# Patient Record
Sex: Female | Born: 1980 | Race: Black or African American | Hispanic: No | Marital: Single | Smoking: Never smoker
Health system: Southern US, Community
[De-identification: ages and names within clinical notes are randomized; demographics above are authoritative.]

## PROBLEM LIST (undated history)

## (undated) DIAGNOSIS — I10 Essential (primary) hypertension: Secondary | ICD-10-CM

## (undated) DIAGNOSIS — IMO0001 Reserved for inherently not codable concepts without codable children: Secondary | ICD-10-CM

## (undated) DIAGNOSIS — L0501 Pilonidal cyst with abscess: Secondary | ICD-10-CM

## (undated) DIAGNOSIS — E119 Type 2 diabetes mellitus without complications: Secondary | ICD-10-CM

## (undated) DIAGNOSIS — R030 Elevated blood-pressure reading, without diagnosis of hypertension: Secondary | ICD-10-CM

## (undated) HISTORY — DX: Essential (primary) hypertension: I10

## (undated) HISTORY — PX: PILONIDAL CYST EXCISION: SHX744

## (undated) HISTORY — DX: Morbid (severe) obesity due to excess calories: E66.01

## (undated) HISTORY — DX: Pilonidal cyst with abscess: L05.01

## (undated) HISTORY — DX: Elevated blood-pressure reading, without diagnosis of hypertension: R03.0

## (undated) HISTORY — PX: PILONIDAL CYST DRAINAGE: SHX743

## (undated) HISTORY — DX: Type 2 diabetes mellitus without complications: E11.9

## (undated) HISTORY — DX: Reserved for inherently not codable concepts without codable children: IMO0001

---

## 2002-03-02 ENCOUNTER — Emergency Department (HOSPITAL_COMMUNITY): Admission: EM | Admit: 2002-03-02 | Discharge: 2002-03-02 | Payer: Self-pay | Admitting: Emergency Medicine

## 2003-08-20 ENCOUNTER — Emergency Department (HOSPITAL_COMMUNITY): Admission: EM | Admit: 2003-08-20 | Discharge: 2003-08-20 | Payer: Self-pay | Admitting: Family Medicine

## 2003-08-23 ENCOUNTER — Observation Stay (HOSPITAL_COMMUNITY): Admission: AD | Admit: 2003-08-23 | Discharge: 2003-08-24 | Payer: Self-pay | Admitting: General Surgery

## 2004-12-18 ENCOUNTER — Other Ambulatory Visit: Admission: RE | Admit: 2004-12-18 | Discharge: 2004-12-18 | Payer: Self-pay | Admitting: Family Medicine

## 2005-08-21 ENCOUNTER — Encounter: Admission: RE | Admit: 2005-08-21 | Discharge: 2005-11-19 | Payer: Self-pay | Admitting: Family Medicine

## 2005-08-27 ENCOUNTER — Ambulatory Visit (HOSPITAL_COMMUNITY): Admission: RE | Admit: 2005-08-27 | Discharge: 2005-08-27 | Payer: Self-pay | Admitting: General Surgery

## 2005-08-27 ENCOUNTER — Encounter (INDEPENDENT_AMBULATORY_CARE_PROVIDER_SITE_OTHER): Payer: Self-pay | Admitting: *Deleted

## 2006-02-07 ENCOUNTER — Other Ambulatory Visit: Admission: RE | Admit: 2006-02-07 | Discharge: 2006-02-07 | Payer: Self-pay | Admitting: Family Medicine

## 2006-07-01 ENCOUNTER — Other Ambulatory Visit: Admission: RE | Admit: 2006-07-01 | Discharge: 2006-07-01 | Payer: Self-pay | Admitting: *Deleted

## 2006-07-17 ENCOUNTER — Other Ambulatory Visit: Admission: RE | Admit: 2006-07-17 | Discharge: 2006-07-17 | Payer: Self-pay | Admitting: *Deleted

## 2007-07-28 ENCOUNTER — Other Ambulatory Visit: Admission: RE | Admit: 2007-07-28 | Discharge: 2007-07-28 | Payer: Self-pay | Admitting: Family Medicine

## 2007-08-20 ENCOUNTER — Other Ambulatory Visit: Admission: RE | Admit: 2007-08-20 | Discharge: 2007-08-20 | Payer: Self-pay | Admitting: Family Medicine

## 2008-08-18 ENCOUNTER — Other Ambulatory Visit: Admission: RE | Admit: 2008-08-18 | Discharge: 2008-08-18 | Payer: Self-pay | Admitting: Family Medicine

## 2009-09-05 ENCOUNTER — Other Ambulatory Visit: Admission: RE | Admit: 2009-09-05 | Discharge: 2009-09-05 | Payer: Self-pay | Admitting: Family Medicine

## 2010-08-08 ENCOUNTER — Other Ambulatory Visit: Payer: Self-pay | Admitting: Internal Medicine

## 2010-08-08 ENCOUNTER — Other Ambulatory Visit (HOSPITAL_COMMUNITY)
Admission: RE | Admit: 2010-08-08 | Discharge: 2010-08-08 | Disposition: A | Discharge status: Home / Self Care | Payer: BC Managed Care – PPO | Source: Ambulatory Visit | Attending: Internal Medicine | Admitting: Internal Medicine

## 2010-08-08 DIAGNOSIS — Z01419 Encounter for gynecological examination (general) (routine) without abnormal findings: Secondary | ICD-10-CM | POA: Insufficient documentation

## 2010-11-10 NOTE — Op Note (Signed)
[name], LANTER NO.:  [id number]   MEDICAL RECORD NO.:  [id number]                   PATIENT TYPE:  AMB   LOCATION:  DAY                                  FACILITY:  [facility]   PHYSICIAN:  Ollen Gross. Vernell Morgans, M.D.              DATE OF BIRTH:  03-Aug-1980   DATE OF PROCEDURE:  08/23/2003  DATE OF DISCHARGE:                                 OPERATIVE REPORT   PREOPERATIVE DIAGNOSIS:  Pilonidal abscess.   POSTOPERATIVE DIAGNOSIS:  Pilonidal abscess.   PROCEDURE:  Incision and drainage of pilonidal abscess.   SURGEON:  Ollen Gross. Carolynne Edouard, M.D.   ANESTHESIA:  General endotracheal.   DESCRIPTION OF PROCEDURE:  After informed consent was obtained, the patient  was brought to the operating room, left in a supine position on the  stretcher.  After adequate induction of general anesthesia, the patient was  rolled onto the operating room table into a prone position and all pressure  points were padded.  Her gluteal cleft area was prepped with Betadine and  draped in the usual sterile manner.  The palpable fluctuant area was opened  longitudinally with a 15 blade knife.  A large amount of purulent material  was able to be evacuated.  The cavity was then fulgurated with the  electrocautery until the area was hemostatic.  The areas around the wound  were then infiltrated with 0.25% Marcaine with epinephrine.  The wound was  packed with gauze and sterile dressings were applied.  The patient tolerated  the procedure well.  At the end of the case all needle, sponge, and  instrument counts were correct.  The patient was then rolled back into a  supine position onto a stretcher and was awakened and taken to the recovery  room in stable condition.                                               Ollen Gross. Vernell Morgans, M.D.    PST/MEDQ  D:  08/24/2003  T:  08/24/2003  Job:  [id number]

## 2010-11-10 NOTE — H&P (Signed)
[name], [name] NO.:  [id number]   MEDICAL RECORD NO.:  [id number]                   PATIENT TYPE:  AMB   LOCATION:  DAY                                  FACILITY:  [facility]   PHYSICIAN:  Jimmye Norman III, M.D.               DATE OF BIRTH:  1981-01-25   DATE OF ADMISSION:  08/23/2003  DATE OF DISCHARGE:                                HISTORY & PHYSICAL   IDENTIFICATION/CHIEF COMPLAINT:  Patient is a 30 year old female with a  recurrent infected pilonidal abscess who is being admitted for emergency  drainage.   HISTORY OF PRESENT ILLNESS:  I briefly saw this patient in the urgent office  at [facility] for an infected pilonidal abscess.  This has  been coming up for the past 4 or 5 days and she has had three previous  drainages before, but no history of a complete excision.  She is not a  diabetic but has been septic from this before with temperatures up to 105  but not on this occasion.   PAST HISTORY:  Significant for these recurrent infections.  She is not  diabetic, has no heart problems, and no significant past medical history.  She has no significant medical problems, no diabetes, no heart problems.  She has no risk factors for infection.   PHYSICAL EXAMINATION:  On exam today she is afebrile, her other vitals are  stable.  She is about 5 feet 9-1/2 to 10 inches tall and weighs 350+ pounds.  I limited my examination to the patient's perineum and her gluteal area.  She had about a 5 x 5 cm very firm and tender fluctuant area just to the  right of the gluteal fold in the superior aspect representing a pilonidal  abscess.  There appeared to be a margin up to about 8 cm with lots of  induration and tenderness.  A rectal exam was not performed.   IMPRESSION:  Recurrent infected pilonidal abscess.   PLAN:  The initial plan was to try to get the patient on the schedule for  elective or semi-elective or urgent surgery tomorrow morning;  however, we  were able to get her on for surgery tonight and Dr. Carolynne Edouard has assumed care of  the patient over at [facility] and he will perform the incision  and drainage, and possibly let the patient go home.  She will get  preoperative antibiotics; amount and type I am not sure of, it will be based  on Dr. Billey Chang evaluation.                                               Kathrin Ruddy, M.D.    JW/MEDQ  D:  08/23/2003  T:  08/23/2003  Job:  [id number]

## 2010-11-10 NOTE — Op Note (Signed)
NAMEJOHNIYA, DURFEE             ACCOUNT NO.:  [id number]   MEDICAL RECORD NO.:  [id number]          PATIENT TYPE:  AMB   LOCATION:  SDS                          FACILITY:  [facility]   PHYSICIAN:  Cherylynn Ridges, M.D.    DATE OF BIRTH:  1981/03/02   DATE OF PROCEDURE:  08/27/2005  DATE OF DISCHARGE:  08/27/2005                                 OPERATIVE REPORT   PREOPERATIVE DIAGNOSIS:  Dormant pilonidal disease.   POSTOPERATIVE DIAGNOSIS:  Dormant pilonidal disease.   OPERATION PERFORMED:  Excision of dormant pilonidal disease.   SURGEON:  Marta Lamas. Lindie Spruce, M.D.   ANESTHESIA:  General endotracheal.   ESTIMATED BLOOD LOSS:  Less than 50 mL.   COMPLICATIONS:  None.   CONDITION:  Stable.   FINDINGS:  The patient had dormant pilonidal disease. No pus was  encountered.  The length of the incision was approximately 13 cm in total  length with the repaired suture line measuring 15 cm in total length.   INDICATIONS FOR PROCEDURE:  The patient is a 30 year old with recurrent  pilonidal disease who comes in now for definitive excision.   DESCRIPTION OF PROCEDURE:  The patient was taken to the operating room and  initially placed on the bed in the supine position.  After an adequate  endotracheal anesthetic was administered, she was flipped into the prone  position and prepped and draped in the usual sterile manner exposing the  superior gluteal area.   We measured the site of the incision as she had mostly disease on the right  perigluteal area.  We excised an oval piece measuring approximately 13 x 4  cm in size and excised it down to the fascia using electrocautery.  Cautery  was used to obtain adequate hemostasis.  We excised down to the fascia and  then removed it.  We marked the specimen at the superior margin and then  sent it off.  We obtained hemostasis, irrigated with saline, removed most  fat globules and then we closed.   We closed in three layers, a deep subcutaneous 2-0  Vicryl layer, more  superficial subcutaneous tissue 3-0 Vicryl layer.  Then the skin was closed  using interrupted simple stitches of 2-0 nylon.  0.5% Marcaine with  epinephrine was injected into the wound, a total of 28 mL used.  No  complications, condition stable.  Sterile dressing was applied.      Cherylynn Ridges, M.D.  Electronically Signed     JOW/MEDQ  D:  08/27/2005  T:  08/27/2005  Job:  [id number]

## 2010-12-29 ENCOUNTER — Ambulatory Visit
Admission: RE | Admit: 2010-12-29 | Discharge: 2010-12-29 | Disposition: A | Discharge status: Home / Self Care | Payer: BC Managed Care – PPO | Source: Ambulatory Visit | Attending: Family Medicine | Admitting: Family Medicine

## 2010-12-29 ENCOUNTER — Other Ambulatory Visit: Payer: Self-pay | Admitting: Family Medicine

## 2010-12-29 DIAGNOSIS — R52 Pain, unspecified: Secondary | ICD-10-CM

## 2011-01-08 ENCOUNTER — Ambulatory Visit (INDEPENDENT_AMBULATORY_CARE_PROVIDER_SITE_OTHER): Payer: BC Managed Care – PPO | Admitting: Family Medicine

## 2011-01-08 ENCOUNTER — Encounter: Payer: Self-pay | Admitting: Family Medicine

## 2011-01-08 VITALS — BP 144/88 | HR 72 | Ht 73.5 in | Wt 398.0 lb

## 2011-01-08 DIAGNOSIS — M654 Radial styloid tenosynovitis [de Quervain]: Secondary | ICD-10-CM

## 2011-01-08 NOTE — Progress Notes (Signed)
Patient presents with complaint of R wrist pain.  She was seen 7/6 by Dr. Abigail Miyamoto at [place] with same complaint, was also having some tingling in L wrist at the time.  She had hand and wrist x-rays bilaterally at [facility], which were normal.  Had pain 2-3 months prior in R wrist, but it had resolved. Recurred again, and has had pain now for 4 weeks.  She is dropping things (ie her cell phone), due to pain.  Took ibuprofen 800mg  4x for one day, which didn't help.  She got a Rx for meloxicam from Dr. Abigail Miyamoto, but never started it (because was told it was the same as the Ibuprofen that didn't work).  She was told to continue with working (lifting heavy boxes)--got worse after working.  This past weekend, while putting on her bra, she had acute worsening of pain, felt like something popped or snapped.  Doing a little bit better now.  She is having pain wiping herself in bathroom.  Pain with movement of her thumb--radiates up her forearm.  Hurts to rotate wrist, like with locking door or feeding herself.  She has a history of elevated blood pressure in the past, never on medication, just diet and exercise.  She monitors BP at home, running 130's/low 80's at home.  She works out regularly (recently re-started, but now limited by her wrist); and has lost 10 pounds or so in the last couple of weeks.  She had been taking Phentermine through a bariatric clinic, but hasn't gone ina while.  She would like to restart the phentermine in the future  Past Medical History  Diagnosis Date  . Elevated BP   . Pilonidal cyst with abscess     Past Surgical History  Procedure Date  . Pilonidal cyst drainage   . Pilonidal cyst excision     History   Social History  . Marital Status: Single    Spouse Name: N/A    Number of Children: 0  . Years of Education: N/A   Occupational History  . overnight International aid/development worker at [employer]    Social History Main Topics  . Smoking status: Never Smoker   . Smokeless tobacco:  Never Used  . Alcohol Use: Yes     1-2 drinks per month  . Drug Use: No  . Sexually Active: Yes    Birth Control/ Protection: Pill   Other Topics Concern  . Not on file   Social History Narrative  . No narrative on file    Family History  Problem Relation Age of Onset  . Hypertension Mother   . Hyperlipidemia Mother   . Hypertension Father   . Hyperlipidemia Father   . Heart disease Maternal Grandmother   . Hypertension Maternal Grandmother   . Diabetes Maternal Grandmother   . Diabetes Paternal Grandmother   . Hypertension Paternal Grandmother   . Cancer Neg Hx     Current outpatient prescriptions:B Complex-C (SUPER B COMPLEX PO), Take 1 tablet by mouth daily.  , Disp: , Rfl: ;  Cholecalciferol (VITAMIN D) 1000 UNITS capsule, Take 1,000 Units by mouth daily.  , Disp: , Rfl: ;  Melatonin 10 MG TABS, Take 1 tablet by mouth as needed.  , Disp: , Rfl: ;  Multiple Vitamins-Minerals (MULTIVITAMIN WITH MINERALS) tablet, Take 1 tablet by mouth daily.  , Disp: , Rfl:  norgestimate-ethinyl estradiol (ORTHO-CYCLEN) 0.25-35 MG-MCG per tablet, Take 1 tablet by mouth daily.  , Disp: , Rfl: ;  phentermine (ADIPEX-P) 37.5 MG  tablet, Take 37.5 mg by mouth daily before breakfast.  , Disp: , Rfl:   No Known Allergies  ROS:  Occasional tingling of L arm, had some tingling of R arm after applying ice. Denies headaches, chest pain, other joint pains, GI complaints, skin rashes, URI complaints or other problems.  PHYSICAL EXAM: BP 144/88  Pulse 72  Ht 6' 1.5" (1.867 m)  Wt 398 lb (180.532 kg)  BMI 51.80 kg/m2  LMP 12/29/2010 Well developed, pleasant, obese African American female in no distress, favoring and not using her R arm/wrist.  Moderate discomfort with examination. Neck: no lymphadenopathy or mass Heart: regular rate and rhythm without murmur Lungs: clear bilaterally Extremities:  No effusion, warmth or swelling at upper extremities.  Tenderness at R radial head (lateral aspect). Pain  with R thumb extension against resistance.  Pain with passive thumb flexion and Finkelstein test. Not tender at base of thumb or MCP joint, mainly just at radius.  L wrist--normal.  Negative Tinel and Phalen test. No pain with wrist flexion and extension, but + pain with pronation and supination against resistance.  Strength is 5/5 throughout  Review of x-ray reports of both wrists and hands from 7/6--all normal  ASSESSMENT/PLAN: 1. Tendonitis, deQuervains    Tendonitis, likely DeQuervains.  Recommend rest, thumb spica splint (rx written for her to [facility] to [facility]).  Take 10-14 day course of NSAID's rx'd by Dr. Abigail Miyamoto.  Discussed ice versus heat.  Light duty at work--no repetitive thumb/wrist movement or heavy lifting with R hand (not over 5 pounds for a week).  Follow up in 2 weeks--consider cortisone injection, vs PT, vs referral to hand specialist.  Elevated BP--continue to monitor elsewhere.  Continue with regular exercise and weight loss, low sodium diet.  She may be candidate for Phentermine in future--once she continues/maintains diet and exercise, and if BP's are improved.  F/u 2 weeks

## 2011-01-08 NOTE — Patient Instructions (Signed)
Rest; Ice vs heat (can try alternating).  Please take the anti-inflammatories as prescribed by Dr. Abigail Miyamoto.  Take for at least a week, likely will need for 10-14 days, or until better.  Follow up here in 2 weeks.  Avoid repetitive use of thumb, wrist.  Light duty (to avoid these movements) at work.  De Quervain's Disease Suzette Battiest disease is a condition often seen in racquet sports where there is a soreness (inflammation) in the cord like structures (tendons) which attach muscle to bone on the thumb side of the wrist. There may be a tightening of the tissues around the tendons. This condition is often helped by giving up or modifying the activity which caused it. When conservative treatment does not help, surgery may be required. Conservative treatment could include changes in the activity which brought about the problem or made it worse. Anti-inflammatory medications and injections may be used to help decrease the inflammation and help with pain control. Your caregiver will help you determine which is best for you. DIAGNOSIS Often the diagnosis (learning what is wrong) can be made by examination. Sometimes x-rays are required. HOME CARE INSTRUCTIONS  Apply ice to the sore area for 15 minutes, 2-3 times per day while awake. Put the ice in a plastic bag and place a towel between the bag of ice and your skin. This is especially helpful if it can be done after all activities involving the sore wrist.   Temporary splinting may help.   Only take over-the-counter or prescription medicines for pain, discomfort or fever as directed by your caregiver.  SEEK MEDICAL CARE IF:  Pain relief is not obtained with medications, or if you have increasing pain and seem to be getting worse rather than better.  MAKE SURE YOU:   Understand these instructions.   Will watch your condition.   Will get help right away if you are not doing well or get worse.  Document Released: 03/06/2001 Document Re-Released:  07/03/2009 [facility] Patient Information 2011 [place], [place].

## 2011-01-22 ENCOUNTER — Ambulatory Visit: Payer: BC Managed Care – PPO | Admitting: Family Medicine

## 2011-02-05 ENCOUNTER — Ambulatory Visit: Payer: BC Managed Care – PPO | Admitting: Family Medicine

## 2011-04-13 ENCOUNTER — Ambulatory Visit (INDEPENDENT_AMBULATORY_CARE_PROVIDER_SITE_OTHER): Payer: BC Managed Care – PPO | Admitting: Medical

## 2011-04-13 ENCOUNTER — Encounter: Payer: Self-pay | Admitting: Medical

## 2011-04-13 VITALS — BP 140/90 | HR 78 | Temp 98.4°F | Resp 20

## 2011-04-13 DIAGNOSIS — T8130XA Disruption of wound, unspecified, initial encounter: Secondary | ICD-10-CM

## 2011-04-13 DIAGNOSIS — IMO0002 Reserved for concepts with insufficient information to code with codable children: Secondary | ICD-10-CM

## 2011-04-13 DIAGNOSIS — L089 Local infection of the skin and subcutaneous tissue, unspecified: Secondary | ICD-10-CM

## 2011-04-13 DIAGNOSIS — T148XXA Other injury of unspecified body region, initial encounter: Secondary | ICD-10-CM

## 2011-04-13 MED ORDER — CEPHALEXIN 500 MG PO CAPS: ORAL_CAPSULE | ORAL | Status: AC

## 2011-04-13 MED ORDER — MUPIROCIN 2 % EX OINT: TOPICAL_OINTMENT | CUTANEOUS | Status: AC

## 2011-04-13 NOTE — Patient Instructions (Signed)
Clean wounds twice daily with soap and water.  Dab dry with towel  Apply mupirocin antibiotic ointment twice daily and cover with extra large band aids.  Use Keflex, 2 tablets twice daily by mouth for 10 days to cover for infection.  You can use 600 mg of Ibuprofen OTC twice daily for inflammation and swelling.  Elevated your leg for swelling as well.   Call if not improving.

## 2011-04-13 NOTE — Progress Notes (Signed)
Subjective:   HPI [name] is a 30 y.o. female who presents for skin concerns/wound.  Was in [place] last week, at a water park on raft ride and fell off.  When she fell off, she scratched her legs and feet in various places. Been using Neosporin topically but the left knee wound has had some pus, left foot wound has been swollen, but neither are healing up completely.   Last tetanus within last 5 years.  Denies head injury, LOC.   No other aggravating or relieving factors.  No other c/o.  The following portions of the patient's history were reviewed and updated as appropriate: allergies, current medications, past family history, past medical history, past social history, past surgical history and problem list.  Review of Systems Gen: no fever, chills, sweats GI: no abdominal pain, NVD MSK: no myalgia, arthralgia     Objective:   Physical Exam  General appearance: alert, no distress, WD/WN Skin: left knee and dorsal foot with 2.5 x 3 cm squarish patch of abrasion, erythema, and slight pus. No induration, fluctuance, or warmth.       Assessment :    Encounter Diagnoses  Name Primary?  . Skin infection Yes  . Wound disruption   . Abrasion      Plan:    She is UTD on tetanus booster per her report.   Begin Keflex, keep wound clean BID, use topical Mupirocin, cover with extra large band aids.  Recheck if not improving or worse.

## 2011-07-31 ENCOUNTER — Telehealth: Payer: Self-pay | Admitting: Internal Medicine

## 2011-07-31 NOTE — Telephone Encounter (Signed)
This is different than OCP in the system/med list (we have ortho cyclen listed--not "tri").  Per computer, it looks like last pap was a year ago.  Okay to refill x 1 month, and schedule CPE/pap.  Please confirm with pt which OCP she is taking. Thanks. Please remind her to check her BP's periodically, and bring list to her CPE visit.

## 2011-08-01 ENCOUNTER — Telehealth: Payer: Self-pay | Admitting: *Deleted

## 2011-08-01 DIAGNOSIS — IMO0001 Reserved for inherently not codable concepts without codable children: Secondary | ICD-10-CM

## 2011-08-01 MED ORDER — NORGESTIM-ETH ESTRAD TRIPHASIC 0.18/0.215/0.25 MG-35 MCG PO TABS: 1.0000 | ORAL_TABLET | Freq: Every day | ORAL | Status: DC

## 2011-08-01 NOTE — Telephone Encounter (Signed)
I went and pulled her chart and the notes from [place] do say that she has always been on ortho-tri cyclen. I changed in her med list and sent refill to pharmacy. I will call her and have her schedule CPE with PAP. Just an FYI.

## 2011-08-02 ENCOUNTER — Telehealth: Payer: Self-pay | Admitting: *Deleted

## 2011-08-02 NOTE — Telephone Encounter (Signed)
Called patient to let her know that her birth control pills were refilled for one month only. She needs to call office and get appt for CPE with PAP asap. Also to periodically be checking her blood pressures and bring a list to CPE.

## 2011-08-03 NOTE — Telephone Encounter (Signed)
done

## 2011-10-10 ENCOUNTER — Ambulatory Visit: Payer: BC Managed Care – PPO | Admitting: Women's Health

## 2011-10-30 ENCOUNTER — Encounter: Payer: Self-pay | Admitting: Internal Medicine

## 2011-11-02 ENCOUNTER — Encounter: Payer: Self-pay | Admitting: Family Medicine

## 2011-11-02 ENCOUNTER — Ambulatory Visit (INDEPENDENT_AMBULATORY_CARE_PROVIDER_SITE_OTHER): Payer: BC Managed Care – PPO | Admitting: Family Medicine

## 2011-11-02 VITALS — BP 144/90 | HR 90 | Temp 98.0°F

## 2011-11-02 DIAGNOSIS — J209 Acute bronchitis, unspecified: Secondary | ICD-10-CM

## 2011-11-02 MED ORDER — AMOXICILLIN 875 MG PO TABS: 875.0000 mg | ORAL_TABLET | Freq: Two times a day (BID) | ORAL | Status: AC

## 2011-11-02 MED ORDER — ALBUTEROL SULFATE HFA 108 (90 BASE) MCG/ACT IN AERS: 2.0000 | INHALATION_SPRAY | Freq: Four times a day (QID) | RESPIRATORY_TRACT | Status: DC | PRN

## 2011-11-02 NOTE — Patient Instructions (Signed)
Take all the antibiotic and if not totally back to normal call me. Usee inhaler as needed for coughing. You can use Tylenol with ibuprofen. You can take 4 ibuprofen 3 times per day

## 2011-11-02 NOTE — Progress Notes (Signed)
  Subjective:    Patient ID: [name], female    DOB: [birth year], 31 y.o.   MRN: [id number]  HPI Approximately one month ago she started having difficulty with coughing, sneezing, rhinorrhea, nasal congestion and headache. She did get better however continues to have difficulty with coughing that started to get worse 2 or 3 days ago. She is also having more nasal congestion and headache some sore throat no fever or chills. She does have underlying allergies and uses Zyrtec. She does not smoke She has also been using OTC medications without success. He does not smoke   Review of Systems     Objective:   Physical Exam alert and in no distress. Tympanic membranes and canals are normal. Throat is clear. Tonsils are normal. Neck is supple without adenopathy or thyromegaly. Cardiac exam shows a regular sinus rhythm without murmurs or gallops. Lungs are clear to auscultation.        Assessment & Plan:   1. Bronchitis, acute    I will place her on Amoxil. Also will give albuterol inhaler. Encouraged her to call me if not entirely better.

## 2011-11-07 ENCOUNTER — Encounter: Payer: BC Managed Care – PPO | Admitting: Family Medicine

## 2011-11-22 IMAGING — CR DG HAND COMPLETE 3+V*R*
3 series · 3 of 3 positions shown · non-contrast
Comparison: [HOSPITAL] at [REDACTED] [HOSPITAL] left hand and
bilateral wrist radiographs 12/29/2010.

CLINICAL DATA: (Right greater than left) hand/wrist pain without
injury.

RIGHT HAND - COMPLETE 3+ VIEW

[x hand pa right]
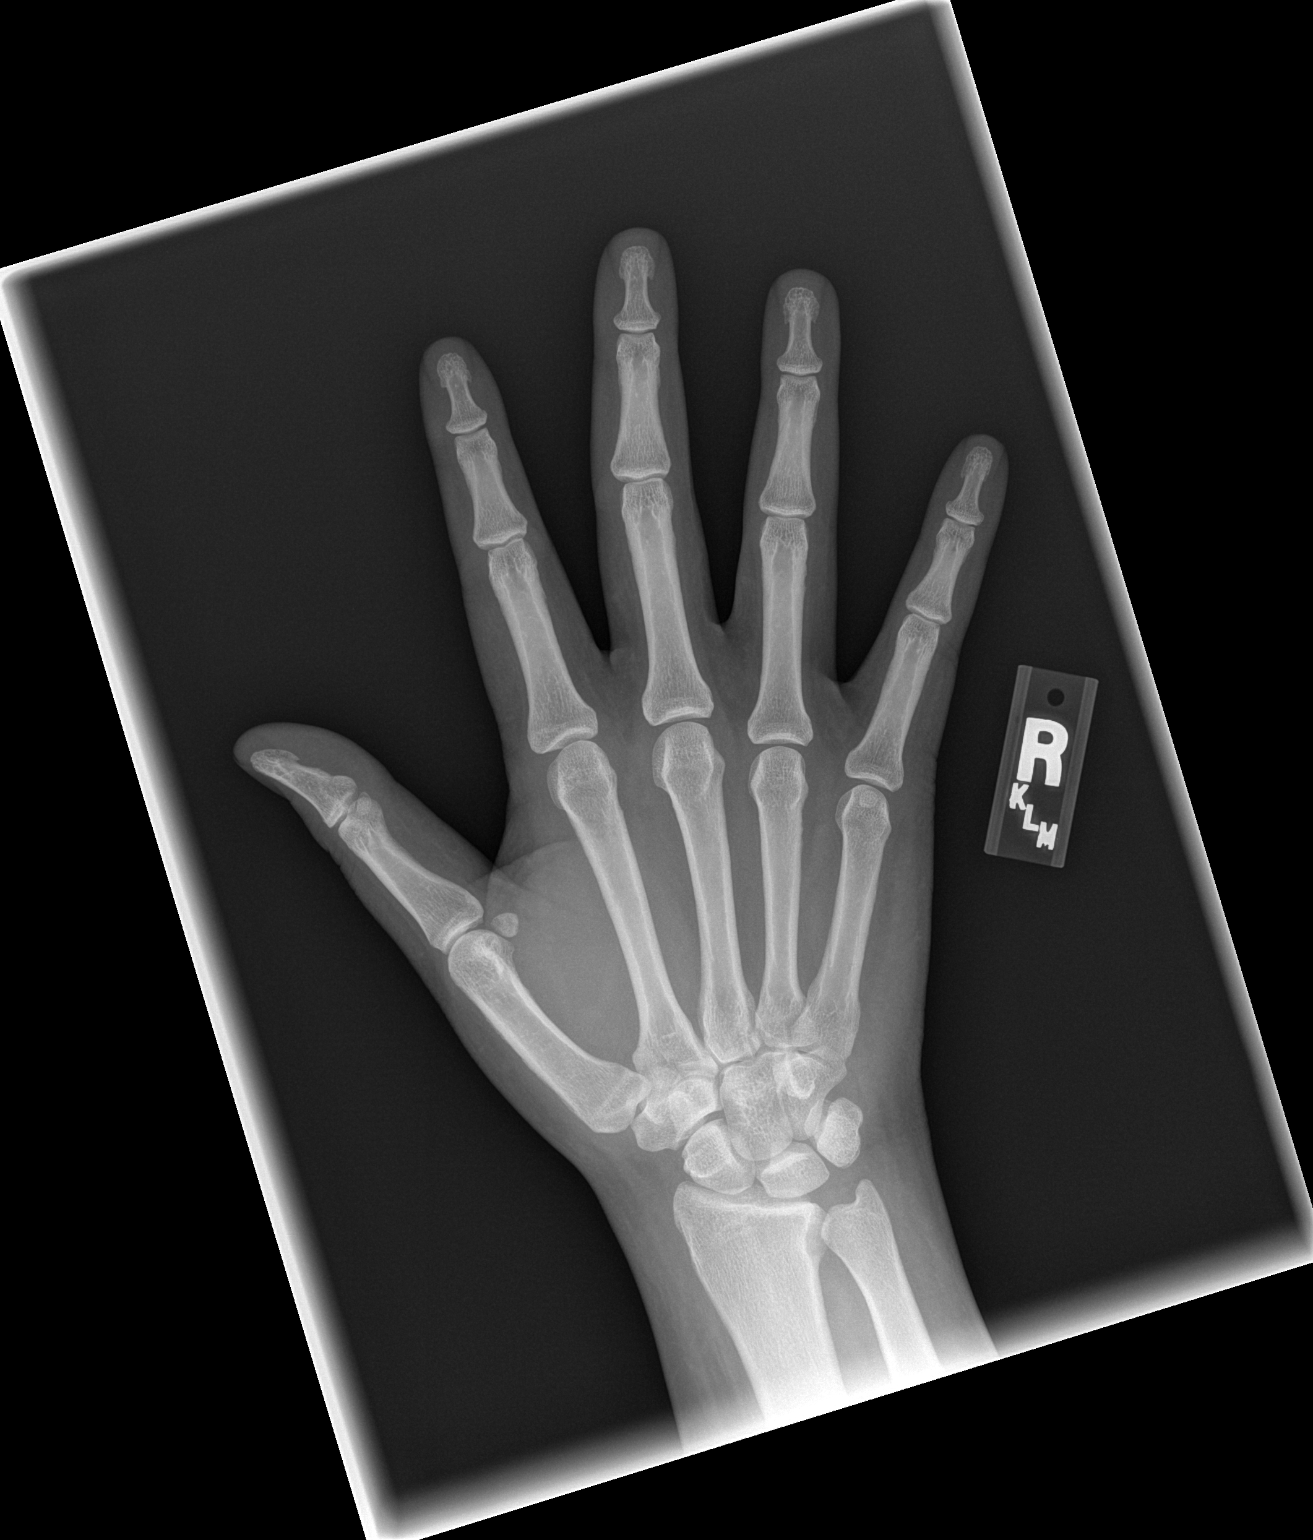

[x hand oblique right]
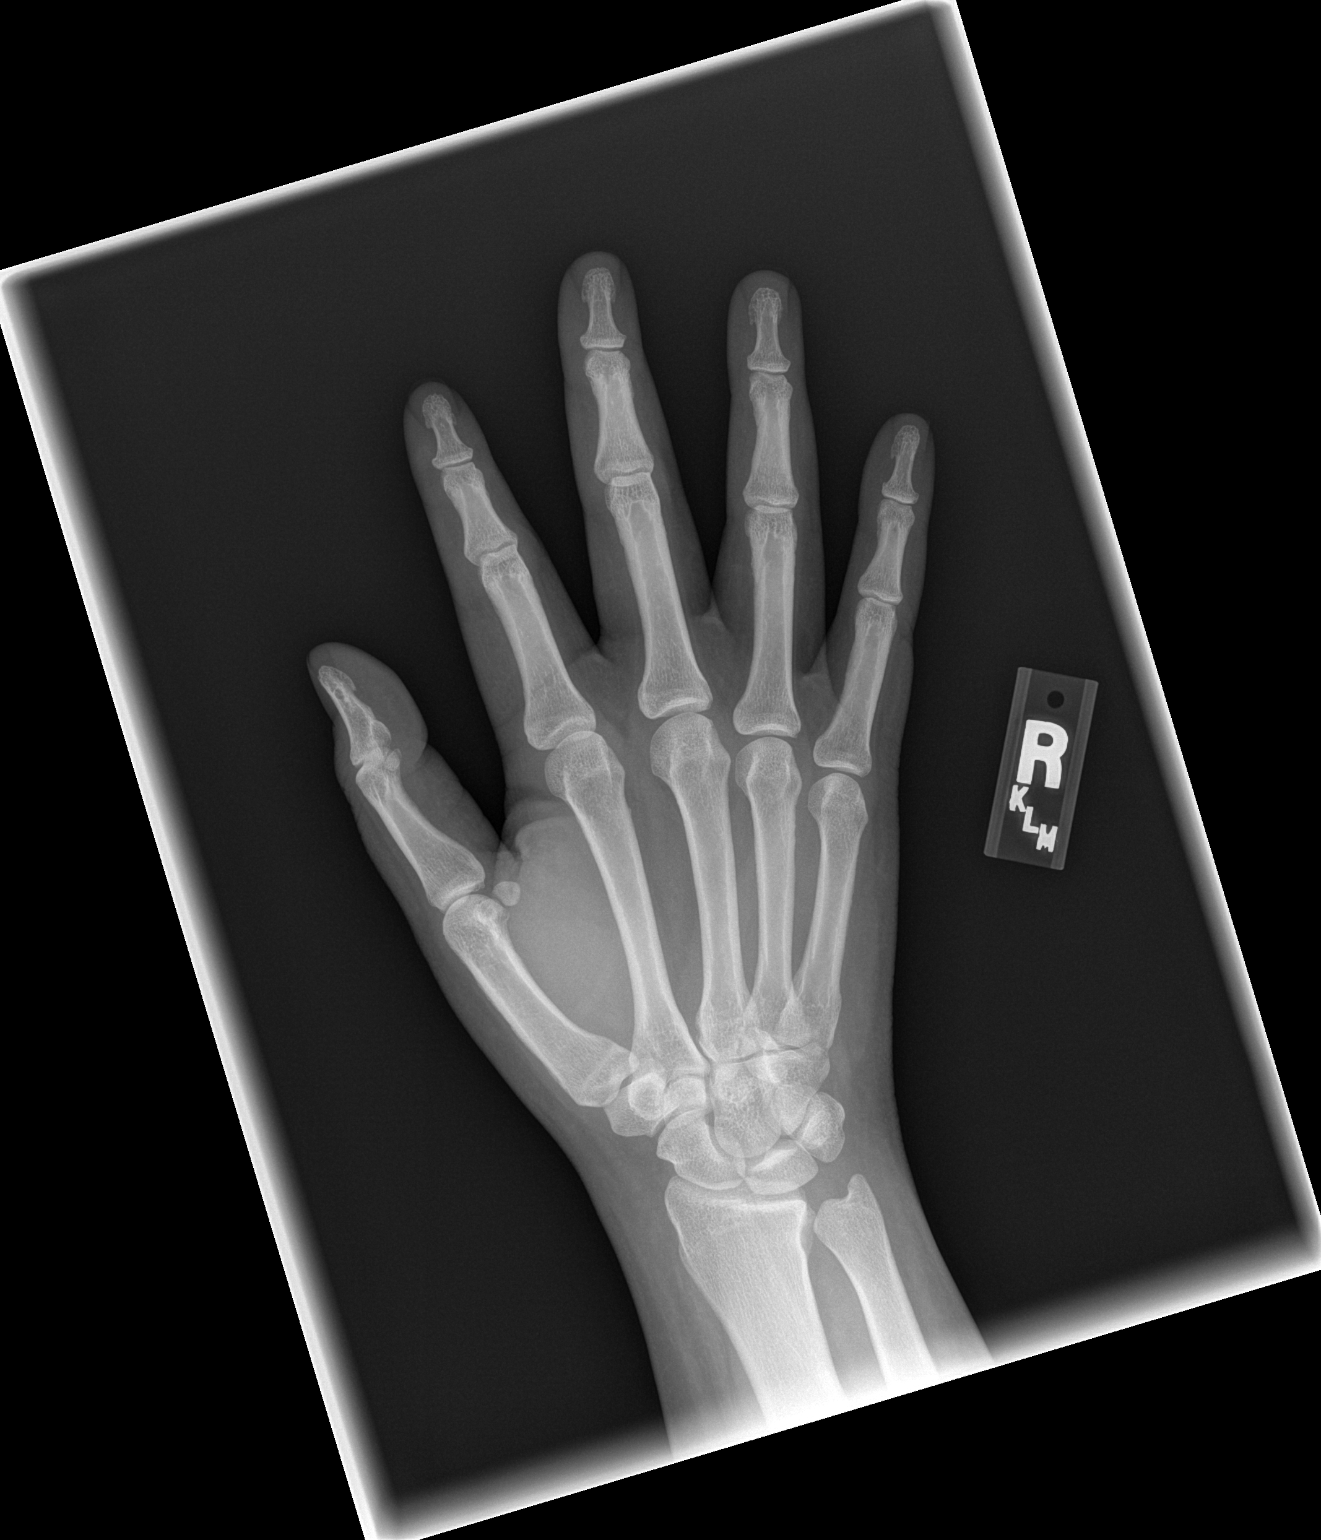

[x hand lat right]
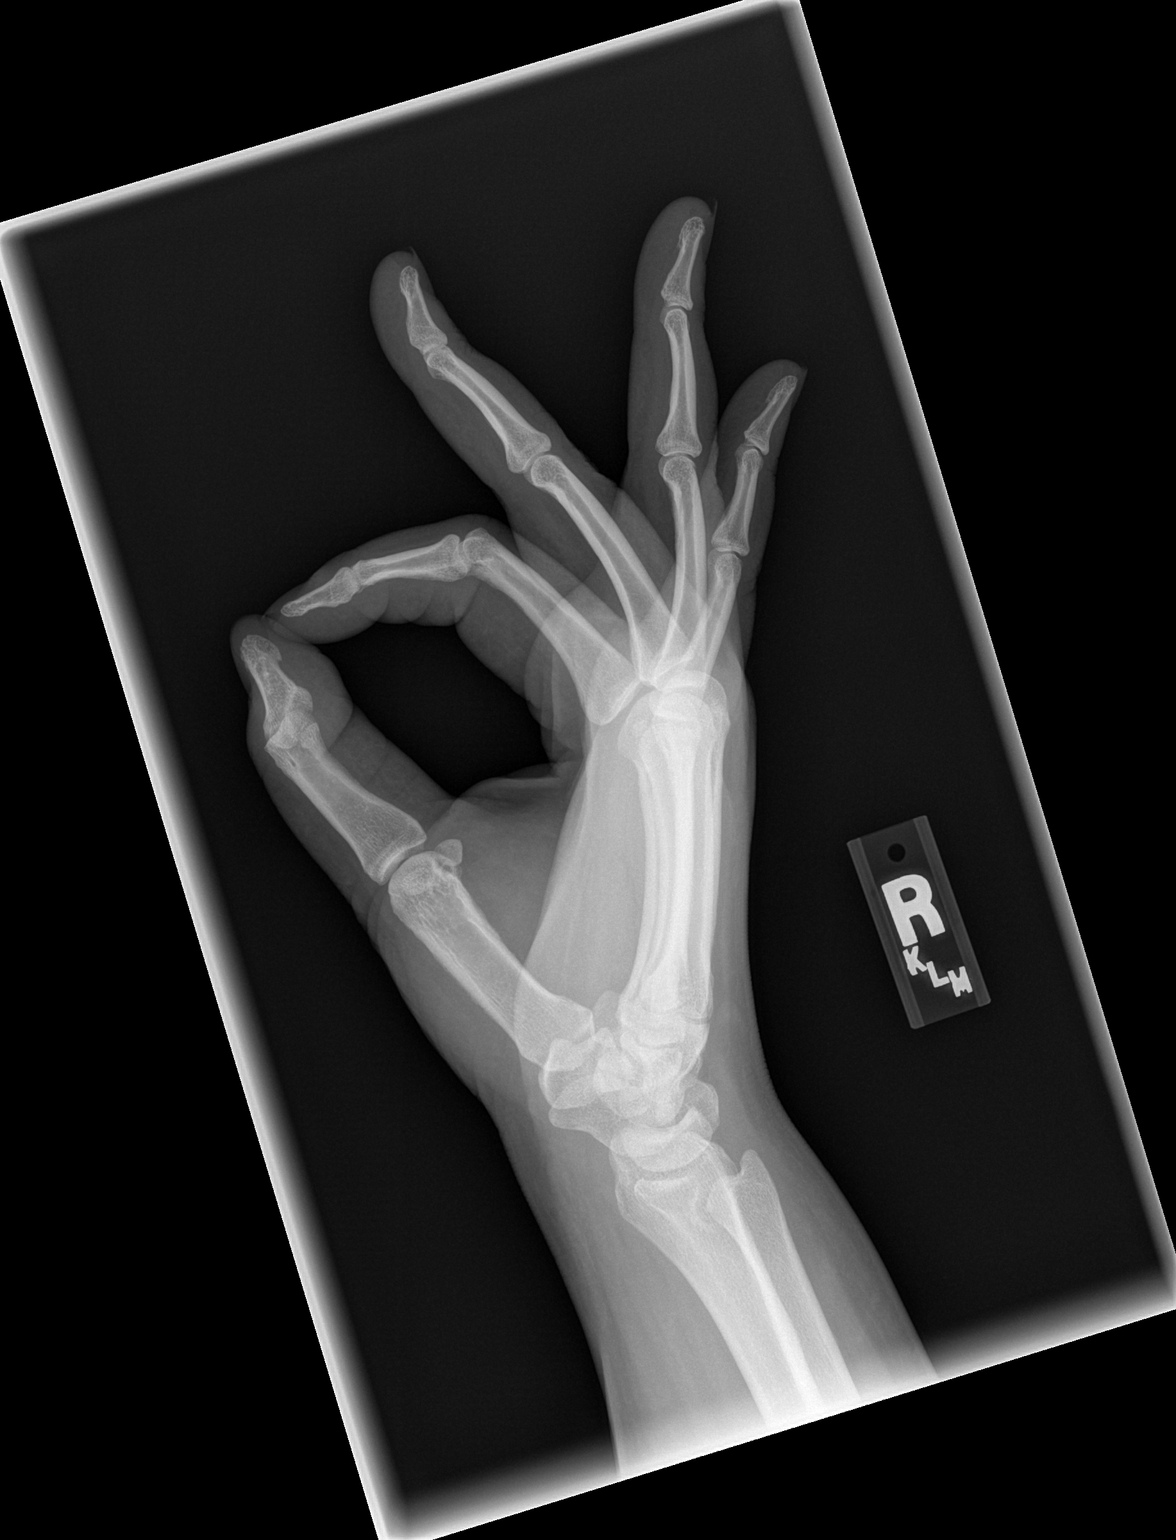

[3 of 3 positions shown; findings below may reference images not displayed]

FINDINGS: No significant osseous, articular, or soft tissue
abnormalities seen.
IMPRESSION: Negative.

## 2011-12-03 ENCOUNTER — Telehealth: Payer: Self-pay | Admitting: Family Medicine

## 2011-12-03 NOTE — Telephone Encounter (Signed)
She needs a followup appointment to evaluate her coughing rather than just call medicine in.

## 2011-12-03 NOTE — Telephone Encounter (Signed)
Pt has appt Thursday  For cough

## 2011-12-06 ENCOUNTER — Ambulatory Visit (INDEPENDENT_AMBULATORY_CARE_PROVIDER_SITE_OTHER): Payer: BC Managed Care – PPO | Admitting: Family Medicine

## 2011-12-06 ENCOUNTER — Encounter: Payer: Self-pay | Admitting: Family Medicine

## 2011-12-06 DIAGNOSIS — J209 Acute bronchitis, unspecified: Secondary | ICD-10-CM

## 2011-12-06 DIAGNOSIS — N760 Acute vaginitis: Secondary | ICD-10-CM

## 2011-12-06 MED ORDER — AZITHROMYCIN 500 MG PO TABS: 500.0000 mg | ORAL_TABLET | Freq: Every day | ORAL | Status: AC

## 2011-12-06 MED ORDER — FLUCONAZOLE 150 MG PO TABS: 150.0000 mg | ORAL_TABLET | Freq: Once | ORAL | Status: AC

## 2011-12-06 NOTE — Patient Instructions (Signed)
Call me in 10-14 days and let me now you're doing

## 2011-12-06 NOTE — Progress Notes (Signed)
  Subjective:    Patient ID: [name], female    DOB: Dec 06, 1980, 31 y.o.   MRN: [id number]  HPI She is here for evaluation of continued difficulty with cough. She states that since her last visit, the cough has improved tremendously but she still does have intermittently productive cough but no fever, chills, sore throat or earache. She does have underlying allergies but usually with sneezing, itchy watery eyes. She does not smoke. She presently is on her menstrual cycle and is having some vaginal itching.   Review of Systems     Objective:   Physical Exam alert and in no distress. Tympanic membranes and canals are normal. Throat is clear. Tonsils are normal. Neck is supple without adenopathy or thyromegaly. Cardiac exam shows a regular sinus rhythm without murmurs or gallops. Lungs are clear to auscultation. Vaginal exam not done due to her menses.       Assessment & Plan:   1. Bronchitis, acute   2. Acute vaginitis    I will treat her with azithromycin. Also discussed use of Diflucan and possibly repeating this in 3 days if she still has symptoms. We'll call me in 10 days to 2 weeks to let me know how she is doing.

## 2012-01-02 ENCOUNTER — Encounter: Payer: BC Managed Care – PPO | Admitting: Family Medicine

## 2012-03-03 ENCOUNTER — Encounter: Payer: BC Managed Care – PPO | Admitting: Family Medicine

## 2012-04-01 ENCOUNTER — Telehealth: Payer: Self-pay | Admitting: Internal Medicine

## 2012-04-01 NOTE — Telephone Encounter (Signed)
Sent medical records to [facility]

## 2012-09-29 ENCOUNTER — Encounter: Payer: Self-pay | Admitting: Family Medicine

## 2012-09-29 ENCOUNTER — Ambulatory Visit (INDEPENDENT_AMBULATORY_CARE_PROVIDER_SITE_OTHER): Payer: BC Managed Care – PPO | Admitting: Family Medicine

## 2012-09-29 VITALS — BP 130/86 | HR 76 | Ht 72.5 in | Wt >= 6400 oz

## 2012-09-29 DIAGNOSIS — Z0000 Encounter for general adult medical examination without abnormal findings: Secondary | ICD-10-CM

## 2012-09-29 DIAGNOSIS — N926 Irregular menstruation, unspecified: Secondary | ICD-10-CM | POA: Insufficient documentation

## 2012-09-29 DIAGNOSIS — R030 Elevated blood-pressure reading, without diagnosis of hypertension: Secondary | ICD-10-CM

## 2012-09-29 DIAGNOSIS — Z8342 Family history of familial hypercholesterolemia: Secondary | ICD-10-CM

## 2012-09-29 DIAGNOSIS — N912 Amenorrhea, unspecified: Secondary | ICD-10-CM

## 2012-09-29 DIAGNOSIS — Z8349 Family history of other endocrine, nutritional and metabolic diseases: Secondary | ICD-10-CM

## 2012-09-29 DIAGNOSIS — I10 Essential (primary) hypertension: Secondary | ICD-10-CM | POA: Insufficient documentation

## 2012-09-29 DIAGNOSIS — N920 Excessive and frequent menstruation with regular cycle: Secondary | ICD-10-CM

## 2012-09-29 LAB — POCT URINALYSIS DIPSTICK
Protein, UA: NEGATIVE
Spec Grav, UA: 1.015
Urobilinogen, UA: NEGATIVE

## 2012-09-29 LAB — CBC WITH DIFFERENTIAL/PLATELET
Basophils Absolute: 0 10*3/uL (ref 0.0–0.1)
Basophils Relative: 0 % (ref 0–1)
MCHC: 33.1 g/dL (ref 30.0–36.0)
Monocytes Absolute: 0.4 10*3/uL (ref 0.1–1.0)
Neutro Abs: 2.4 10*3/uL (ref 1.7–7.7)
Neutrophils Relative %: 45 % (ref 43–77)
RDW: 14.1 % (ref 11.5–15.5)

## 2012-09-29 LAB — TSH: TSH: 3 u[IU]/mL (ref 0.350–4.500)

## 2012-09-29 LAB — COMPREHENSIVE METABOLIC PANEL
ALT: 19 U/L (ref 0–35)
AST: 30 U/L (ref 0–37)
Albumin: 3.9 g/dL (ref 3.5–5.2)
Calcium: 9.3 mg/dL (ref 8.4–10.5)
Chloride: 104 mEq/L (ref 96–112)
Potassium: 4.4 mEq/L (ref 3.5–5.3)
Sodium: 138 mEq/L (ref 135–145)

## 2012-09-29 LAB — LIPID PANEL
LDL Cholesterol: 120 mg/dL — ABNORMAL HIGH (ref 0–99)
VLDL: 15 mg/dL (ref 0–40)

## 2012-09-29 MED ORDER — MEDROXYPROGESTERONE ACETATE 10 MG PO TABS: 10.0000 mg | ORAL_TABLET | Freq: Every day | ORAL | Status: DC

## 2012-09-29 MED ORDER — NORGESTIM-ETH ESTRAD TRIPHASIC 0.18/0.215/0.25 MG-35 MCG PO TABS: 1.0000 | ORAL_TABLET | Freq: Every day | ORAL | Status: DC

## 2012-09-29 NOTE — Patient Instructions (Addendum)
HEALTH MAINTENANCE RECOMMENDATIONS:  It is recommended that you get at least 30 minutes of aerobic exercise at least 5 days/week (for weight loss, you may need as much as 60-90 minutes). This can be any activity that gets your heart rate up. This can be divided in 10-15 minute intervals if needed, but try and build up your endurance at least once a week.  Weight bearing exercise is also recommended twice weekly.  Eat a healthy diet with lots of vegetables, fruits and fiber.  "Colorful" foods have a lot of vitamins (ie green vegetables, tomatoes, red peppers, etc).  Limit sweet tea, regular sodas and alcoholic beverages, all of which has a lot of calories and sugar.  Up to 1 alcoholic drink daily may be beneficial for women (unless trying to lose weight, watch sugars).  Drink a lot of water.  Calcium recommendations are 1200-1500 mg daily (1500 mg for postmenopausal women or women without ovaries), and vitamin D 1000 IU daily.  This should be obtained from diet and/or supplements (vitamins), and calcium should not be taken all at once, but in divided doses.  Monthly self breast exams and yearly mammograms for women over the age of 59 is recommended.  Sunscreen of at least SPF 30 should be used on all sun-exposed parts of the skin when outside between the hours of 10 am and 4 pm (not just when at beach or pool, but even with exercise, golf, tennis, and yard work!)  Use a sunscreen that says "broad spectrum" so it covers both UVA and UVB rays, and make sure to reapply every 1-2 hours.  Remember to change the batteries in your smoke detectors when changing your clock times in the spring and fall.  Use your seat belt every time you are in a car, and please drive safely and not be distracted with cell phones and texting while driving.  Take the Provera 10mg  x 10 days.  Expect withdrawal bleed (period) after stopping Provera.  Then start birth control pills afterwards (start the Sunday after your  period--allow yourself a week of period/bleeding).  You will need to use condoms through the first pack of birth control pills. Return for pap smear/pelvic exam when you are NOT bleeding.  You must get at least 6 hours of sleep each night.  It is very important that you work on [employer], as well as exercise, in order to lose weight.

## 2012-09-29 NOTE — Progress Notes (Signed)
Chief Complaint  Patient presents with  . Annual Exam    fasting annual exam, did not do eye exam as she just had eye exam in March with Dr.Painter. She hasn't had a period x 6 months, did get one a month ago and it has been continuing x 1 month.   [name] is a 32 y.o. female who presents for a complete physical.  She has the following concerns:  She stopped taking her birth control pills about 6 months ago.  She had one pack left, so started taking it again (2 months ago), didn't have a period while off pills. She bled heavily for about 2 weeks after taking this last pack, then it eased off.  Has been spotting constantly since then.  She is in a monogamous relationship x 4 years.  Not currently using any contraception. Denies vaginal discharge, odor, itch.  She works two jobs--one is 9am-5:30 pm, and second job is 9:30pm-7:30 am every other week.  Only sleeps 3 hours/day for 2 weeks/month (every other week, when also working nights).  Only recently started exercising.  Gained weight back that she had lost (had gotten to around 380-390)  Health Maintenance: Immunization History  Administered Date(s) Administered  . Tdap 01/19/2010   Last Pap smear: 07/2010. Denies h/o abnormal paps Last mammogram: never Last colonoscopy: never Last DEXA: never Dentist: every 6 months Ophtho: March Exercise: 3x/week working with trainer (cardio and weights) for 30-60 minutes.  Just recently started back up with exercise, in the last 1-2 weeks.  Past Medical History  Diagnosis Date  . Elevated BP   . Pilonidal cyst with abscess   . Morbid obesity     Past Surgical History  Procedure Laterality Date  . Pilonidal cyst drainage    . Pilonidal cyst excision      History   Social History  . Marital Status: Single    Spouse Name: N/A    Number of Children: 0  . Years of Education: N/A   Occupational History  . overnight International aid/development worker at [employer]   . Therapist, sports at [employer]    Social History Main Topics  . Smoking status: Never Smoker   . Smokeless tobacco: Never Used  . Alcohol Use: Yes     Comment: 1-2 drinks per month  . Drug Use: No  . Sexually Active: Yes -- Female partner(s)    Birth Control/ Protection: None   Other Topics Concern  . Not on file   Social History Narrative   Single.  Lives with boyfriend    Family History  Problem Relation Age of Onset  . Hypertension Mother   . Hypertension Father   . Hyperlipidemia Father   . Heart disease Maternal Grandmother   . Hypertension Maternal Grandmother     on dialysis  . Diabetes Maternal Grandmother   . Kidney disease Maternal Grandmother   . Diabetes Paternal Grandmother   . Hypertension Paternal Grandmother   . Cancer Neg Hx   . Healthy Sister   . Healthy Brother   . Healthy Sister    Current Outpatient Prescriptions on File Prior to Visit  Medication Sig Dispense Refill  . B Complex-C (SUPER B COMPLEX PO) Take 1 tablet by mouth daily.        . Cholecalciferol (VITAMIN D) 1000 UNITS capsule Take 1,000 Units by mouth daily.        . Melatonin 10 MG TABS Take 1 tablet by mouth as needed.        Marland Kitchen  Multiple Vitamins-Minerals (MULTIVITAMIN WITH MINERALS) tablet Take 1 tablet by mouth daily.        . cetirizine (ZYRTEC) 10 MG tablet Take 10 mg by mouth daily.       No current facility-administered medications on file prior to visit.     No Known Allergies  ROS:  The patient denies anorexia, fever, headaches,  vision changes, decreased hearing, ear pain, sore throat, breast concerns, chest pain, palpitations, dizziness, syncope, dyspnea on exertion, cough, swelling, nausea, vomiting, diarrhea, constipation, abdominal pain, melena, hematochezia, indigestion/heartburn, hematuria, incontinence, dysuria, vaginal discharge, odor or itch, genital lesions, joint pains, numbness, tingling, weakness, tremor, suspicious skin lesions, depression, anxiety, abnormal bleeding/bruising, or enlarged  lymph nodes. +irregular menses, weight gain. +intermittent problems with boils, used to be in axilla, now getting below lower on chest wall.  Recent ones on L side, resolving  PHYSICAL EXAM: BP 130/86  Pulse 76  Ht 6' 0.5" (1.842 m)  Wt 440 lb (199.583 kg)  BMI 58.82 kg/m2  LMP 08/29/2012  General Appearance:    Alert, cooperative, no distress, appears stated age, morbidly obese  Head:    Normocephalic, without obvious abnormality, atraumatic  Eyes:    PERRL, conjunctiva/corneas clear, EOM's intact, fundi    benign  Ears:    Normal TM's and external ear canals  Nose:   Nares normal, mucosa normal, no drainage or sinus   tenderness  Throat:   Lips, mucosa, and tongue normal; teeth and gums normal  Neck:   Supple, no lymphadenopathy;  thyroid:  no   enlargement/tenderness/nodules; no carotid   bruit or JVD  Back:    Spine nontender, no curvature, ROM normal, no CVA     tenderness  Lungs:     Clear to auscultation bilaterally without wheezes, rales or     ronchi; respirations unlabored  Chest Wall:    No tenderness or deformity   Heart:    Regular rate and rhythm, S1 and S2 normal, no murmur, rub   or gallop  Breast Exam:    No tenderness, masses, or nipple discharge or inversion.      No axillary lymphadenopathy  Abdomen:     Soft, non-tender, nondistended, normoactive bowel sounds,    no masses, no hepatosplenomegaly. obese  Genitalia:    Deferred per pt request due to vaginal bleeding  Rectal:    Not performed due to age<40 and no related complaints  Extremities:   No clubbing, cyanosis or edema  Pulses:   2+ and symmetric all extremities  Skin:   Skin color, texture, turgor normal, no rashes. Healing burn upper abdomen (hyperpigmented) Below L axilla--indurated area with slight peeling.  No erythema or fluctuance. Healing abscess per history, no active infection that requires ABX or drainage.  Lymph nodes:   Cervical, supraclavicular, and axillary nodes normal  Neurologic:    CNII-XII intact, normal strength, sensation and gait; reflexes 2+ and symmetric throughout          Psych:   Normal mood, affect, hygiene and grooming.      ASSESSMENT/PLAN:  Routine general medical examination at a health care facility - Plan: POCT Urinalysis Dipstick, Lipid panel, Comprehensive metabolic panel, CBC with Differential, TSH, HIV Antibody, CANCELED: Cytology - PAP  Irregular menses - Plan: TSH, Norgestimate-Ethinyl Estradiol Triphasic 0.18/0.215/0.25 MG-35 MCG tablet, POCT urine pregnancy  Heavy menses - Plan: CBC with Differential  Obesity, morbid, BMI 50 or higher - Plan: Comprehensive metabolic panel  Family history of high cholesterol - Plan: Lipid panel  Elevated blood pressure - Plan: Comprehensive metabolic panel  Amenorrhea - Plan: medroxyPROGESTERone (PROVERA) 10 MG tablet, POCT urine pregnancy   Morbid obesity--discussed healthy diet, portion control, need for daily exercise Discussed sleep requirements--working too much, inadequate sleep time (every other week), and risks associated with this.  Discussed monthly self breast exams and yearly mammograms after the age of 18; at least 30 minutes of aerobic activity at least 5 days/week; proper sunscreen use reviewed; healthy diet, including goals of calcium and vitamin D intake and alcohol recommendations (less than or equal to 1 drink/day) reviewed; regular seatbelt use; changing batteries in smoke detectors.  Immunization recommendations discussed--UTD.   Declined pap today due to bleeding.  She prefers to return when cycles more regular, and schedule when not on period.  Amenorrhea--likely anovulatory cycles.  Having ongoing bleeding since use of last pack of OCP's.  Plan to stabilize uterine lining with Provera, then get back on OCP's. Discussed need for back up contraception until on second month of OCP's.  Provera 10mg  x 10 days.  Expect withdrawal bleed (period) after stopping Provera.  Then start birth  control pills afterwards (start the Sunday after your period--allow yourself a week of period/bleeding).  You will need to use condoms through the first pack of birth control pills. Return for pap smear/pelvic exam when you are NOT bleeding.  Risks/side effects of meds reviewed

## 2012-09-30 ENCOUNTER — Encounter: Payer: Self-pay | Admitting: Family Medicine

## 2012-09-30 LAB — HIV ANTIBODY (ROUTINE TESTING W REFLEX): HIV: NONREACTIVE

## 2013-01-29 ENCOUNTER — Encounter: Payer: BC Managed Care – PPO | Attending: Obstetrics and Gynecology | Admitting: *Deleted

## 2013-01-29 ENCOUNTER — Encounter: Payer: Self-pay | Admitting: *Deleted

## 2013-01-29 DIAGNOSIS — Z713 Dietary counseling and surveillance: Secondary | ICD-10-CM | POA: Insufficient documentation

## 2013-01-29 NOTE — Progress Notes (Signed)
Medical Nutrition Therapy:  Appt start time: 1645 end time:  1745.  Assessment:  Primary concern today: Pre-diabetes, weight management. Patient with HgbA1c of 6.3. She is currently on metformin. She has been trying to eat healthy, limit fried food, and exercise regularly. She works 2 jobs, working 17 hours a day every other week. She has created an exercise plan that works for her schedule. She has lost about 4 pounds in 1 month. She is tracking her diet using Myfitnesspal app on her phone.   MEDICATIONS: Reviewed, metformin   DIETARY INTAKE:   Usual eating pattern includes 3 meals and 2-3 snacks per day.  24-hr recall:  B ( AM): Sometimes skips, banana, Greek yogurt  Snk ( AM): Chips, nuts, cherries/fruit, Triscuits, cheese  L ( PM): Subway salad OR P[employer]ight pita sandwich  Snk ( PM): Same D ( PM): [place] meatloaf/chicken, cabbage/broccoli OR stirfy (meat/vegetables) Snk ( PM): Same Beverages: Water, diet Lipton tea  Usual physical activity: Elliptical 30 min, treadmill 15 min,  MSS: Gym AM, work weeks TWTF: No exercise Daily on weeks off of [facility].    Estimated energy needs: 2000 calories 225 g carbohydrates 125 g protein 67 g fat  Progress Towards Goal(s):  In progress.   Nutritional Diagnosis:  NB-1.1 Food and nutrition-related knowledge deficit As related to pre-diabetes.  As evidenced by no prior education.    Intervention:  Nutrition counseling. We discussed basic carb counting, including foods with carbs, label reading, portion size, and meal planning. We also discussed strategies for weight loss, including balancing nutrients (carbs, protein, fat), portion control, healthy snacks, and exercise.   Goals:  1. 2-3 carb portions at meals. 1 portion at snacks.  2. Monitor portions.  3. Balance macronutrients at meals. 4. Read food labels. 5. Bring healthy meal to second job (leftover, sandwich, soup, salad, etc.) 6. Continue exercising per current plan.  7. 1-2  pound weight loss per week. Short term goal weight: 440 pounds  Handouts given during visit include:  Count your carb booklet  Yellow meal plan card  Monitoring/Evaluation:  Dietary intake, exercise, blood glucose, and body weight in 2 month(s).

## 2013-04-09 ENCOUNTER — Encounter: Payer: Self-pay | Admitting: *Deleted

## 2013-04-09 ENCOUNTER — Encounter: Payer: BC Managed Care – PPO | Attending: Obstetrics and Gynecology | Admitting: *Deleted

## 2013-04-09 DIAGNOSIS — Z713 Dietary counseling and surveillance: Secondary | ICD-10-CM | POA: Insufficient documentation

## 2013-04-09 NOTE — Progress Notes (Signed)
Medical Nutrition Therapy:  Appt start time: 1415 end time:  1445.  Assessment:  Patient returns today for a follow up visit for weight management. She has lost about 17 pounds in the last 2 months, which is about 2 pounds per week. She does state that she was hoping for more weight loss, but I reinforced the idea of slow weight loss (up to 2 pounds per week), and praised her on her efforts. She has been following a low carb, low sugar diet for the last 3 weeks, but reports that she is getting bored with her food choices. She continues to exercise regularly.   MEDICATIONS: Reviewed   DIETARY INTAKE:   Usual eating pattern includes 3 meals and 3 snacks per day.  24-hr recall:  B ( AM): Fruit, ham, cheese, water  Snk ( AM): Nuts, pork rinds, cheese, sugar-free jello, yogurt L ( PM): Ham/turkey meatloaf/grilled chicken, cabbage/broccoli/peppers OR salad Snk ( PM): Same D ( PM): Same as lunch Snk ( PM): Same Beverages: Water, diet tea  Usual physical activity: Elliptical 30 min, treadmill 15 min,  MSS: Gym AM, work weeks  TWTF: No exercise  Daily on weeks off of [facility].   Estimated energy needs: 2000 calories  225 g carbohydrates  125 g protein  67 g fat  Progress Towards Goal(s):  Some progress.   Nutritional Diagnosis:  NB-1.1 Food and nutrition-related knowledge deficit As related to pre-diabetes. As evidenced by no prior education, ongoing     Intervention:  Nutrition counseling. Reinforced strategies for healthy weight loss. We discussed new cooking and flavoring techniques to improve food choices. We also discussed how to incorporate healthy carbs back in the diet in moderation.   Goals:  1. 2-3 carb portions at meals. 1 portion at snacks.  2. Continue to monitor portions.  3. Balance macronutrients at meals.  4. Continue exercising per current plan.  7. 1-2 pound weight loss per week. Short term goal weight: 430 pounds by next visit  Handouts given during visit  include:  Yellow meal plan card  List of healthy carbs   Monitoring/Evaluation:  Dietary intake, exercise, blood glucose, and body weight in 3 month(s).

## 2013-05-07 ENCOUNTER — Ambulatory Visit: Payer: BC Managed Care – PPO | Admitting: Podiatry

## 2013-07-02 ENCOUNTER — Ambulatory Visit: Payer: BC Managed Care – PPO | Admitting: *Deleted

## 2013-09-07 ENCOUNTER — Ambulatory Visit: Payer: BC Managed Care – PPO | Admitting: *Deleted

## 2013-10-26 ENCOUNTER — Ambulatory Visit: Payer: BC Managed Care – PPO | Admitting: *Deleted

## 2015-02-07 DIAGNOSIS — E282 Polycystic ovarian syndrome: Secondary | ICD-10-CM | POA: Insufficient documentation

## 2016-07-26 ENCOUNTER — Ambulatory Visit: Payer: Self-pay | Admitting: Family Medicine

## 2021-05-09 ENCOUNTER — Ambulatory Visit: Payer: Self-pay | Admitting: Podiatry

## 2021-05-11 ENCOUNTER — Ambulatory Visit: Payer: Federal, State, Local not specified - PPO | Admitting: Podiatry

## 2021-05-11 ENCOUNTER — Ambulatory Visit (INDEPENDENT_AMBULATORY_CARE_PROVIDER_SITE_OTHER): Payer: Federal, State, Local not specified - PPO

## 2021-05-11 ENCOUNTER — Other Ambulatory Visit: Payer: Self-pay

## 2021-05-11 DIAGNOSIS — M7672 Peroneal tendinitis, left leg: Secondary | ICD-10-CM

## 2021-05-11 DIAGNOSIS — S9032XA Contusion of left foot, initial encounter: Secondary | ICD-10-CM | POA: Diagnosis not present

## 2021-05-11 DIAGNOSIS — M778 Other enthesopathies, not elsewhere classified: Secondary | ICD-10-CM | POA: Diagnosis not present

## 2021-05-11 MED ORDER — MELOXICAM 15 MG PO TABS: 15.0000 mg | ORAL_TABLET | Freq: Every day | ORAL | 0 refills | Status: AC | PRN

## 2021-05-11 NOTE — Patient Instructions (Signed)
Peroneal Tendinopathy Rehab ?Ask your health care provider which exercises are safe for you. Do exercises exactly as told by your health care provider and adjust them as directed. It is normal to feel mild stretching, pulling, tightness, or discomfort as you do these exercises. Stop right away if you feel sudden pain or your pain gets worse. Do not begin these exercises until told by your health care provider. ?Stretching and range-of-motion exercises ?These exercises warm up your muscles and joints and improve the movement and flexibility of your ankle. These exercises also help to relieve pain and stiffness. ?Gastroc and soleus stretch, standing ?This is an exercise in which you stand on a step and use your body weight to stretch your calf muscles. To do this exercise: ?Stand on the edge of a step on the ball of your left / right foot. The ball of your foot is on the walking surface, right under your toes. ?Keep your other foot firmly on the same step. ?Hold on to the wall, a railing, or a chair for balance. ?Slowly lift your other foot, allowing your body weight to press your left / right heel down over the edge of the step. You should feel a stretch in your left / right calf (gastrocnemius and soleus). ?Hold this position for __________ seconds. ?Return both feet to the step. ?Repeat this exercise with a slight bend in your left / right knee. ?Repeat __________ times with your left / right knee straight and __________ times with your left / right knee bent. Complete this exercise __________ times a day. ?Strengthening exercises ?These exercises build strength and endurance in your foot and ankle. Endurance is the ability to use your muscles for a long time, even after they get tired. ?Ankle dorsiflexion with band ? ?Secure a rubber exercise band or tube to an object, such as a table leg, that will not move when the band is pulled. ?Secure the other end of the band around your left / right foot. ?Sit on the  floor, facing the object with your left / right leg extended. The band or tube should be slightly tense when your foot is relaxed. ?Slowly flex your left / right ankle and toes to bring your foot toward you (dorsiflexion). ?Hold this position for __________ seconds. ?Let the band or tube slowly pull your foot back to the starting position. ?Repeat __________ times. Complete this exercise __________ times a day. ?Ankle eversion ?Sit on the floor with your legs straight out in front of you. ?Loop a rubber exercise band or tube around the ball of your left / right foot. The ball of your foot is on the walking surface, right under your toes. ?Hold the ends of the band in your hands, or secure the band to a stable object. The band or tube should be slightly tense when your foot is relaxed. ?Slowly push your foot outward, away from your other leg (eversion). ?Hold this position for __________ seconds. ?Slowly return your foot to the starting position. ?Repeat __________ times. Complete this exercise __________ times a day. ?Plantar flexion, standing ?This exercise is sometimes called standing heel raise. ?Stand with your feet shoulder-width apart. ?Place your hands on a wall or table to steady yourself as needed, but try not to use it for support. ?Keep your weight spread evenly over the width of your feet while you slowly rise up on your toes (plantar flexion). If told by your health care provider: ?Shift your weight toward your left / right   leg until you feel challenged. ?Stand on your left / right leg only. ?Hold this position for __________ seconds. ?Repeat __________ times. Complete this exercise __________ times a day. ?Single leg stand ?Without shoes, stand near a railing or in a doorway. You may hold on to the railing or door frame as needed. ?Stand on your left / right foot. Keep your big toe down on the floor and try to keep your arch lifted. ?Do not roll to the outside of your foot. ?If this exercise is too  easy, you can try it with your eyes closed or while standing on a pillow. ?Hold this position for __________ seconds. ?Repeat __________ times. Complete this exercise __________ times a day. ?This information is not intended to replace advice given to you by your health care provider. Make sure you discuss any questions you have with your health care provider. ?Document Revised: 09/30/2018 Document Reviewed: 09/30/2018 ?Elsevier Patient Education ? 2022 [employer] Inc[employer] ? ?

## 2021-05-14 NOTE — Progress Notes (Signed)
Subjective:   Patient ID: [name], female   DOB: 40 y.o.   MRN: [id number]   HPI 40 year old female presents the office today for concerns of left heel pain.  She states this is ongoing for least 1 month.  She does like to do CrossFit.  At first she got it with the arch of her foot and plantar fasciitis but the pain is been going to the outside aspect of her ankle.  She is a hard tender toes at times.  She denies recent injury.  She tried ice, ibuprofen, elevation with minimal improvement.  No issues on the right foot.  Symptoms are worse with working out.  No other concerns.   Review of Systems  All other systems reviewed and are negative.  Past Medical History:  Diagnosis Date   Elevated BP    Morbid obesity (HCC)    Pilonidal cyst with abscess     Past Surgical History:  Procedure Laterality Date   PILONIDAL CYST DRAINAGE     PILONIDAL CYST EXCISION       Current Outpatient Medications:    meloxicam (MOBIC) 15 MG tablet, Take 1 tablet (15 mg total) by mouth daily as needed for pain., Disp: 30 tablet, Rfl: 0   B Complex-C (SUPER B COMPLEX PO), Take 1 tablet by mouth daily.  , Disp: , Rfl:    cetirizine (ZYRTEC) 10 MG tablet, Take 10 mg by mouth daily., Disp: , Rfl:    Cholecalciferol (VITAMIN D) 1000 UNITS capsule, Take 1,000 Units by mouth daily.  , Disp: , Rfl:    medroxyPROGESTERone (PROVERA) 10 MG tablet, Take 1 tablet (10 mg total) by mouth daily., Disp: 10 tablet, Rfl: 0   Melatonin 10 MG TABS, Take 1 tablet by mouth as needed.  , Disp: , Rfl:    Multiple Vitamins-Minerals (MULTIVITAMIN WITH MINERALS) tablet, Take 1 tablet by mouth daily.  , Disp: , Rfl:    Norgestimate-Ethinyl Estradiol Triphasic 0.18/0.215/0.25 MG-35 MCG tablet, Take 1 tablet by mouth daily., Disp: 1 Package, Rfl: 2  No Known Allergies       Objective:  Physical Exam  General: AAO x3, NAD  Dermatological: Skin is warm, dry and supple bilateral. There are no open sores, no preulcerative  lesions, no rash or signs of infection present.  Vascular: Dorsalis Pedis artery and Posterior Tibial artery pedal pulses are 2/4 bilateral with immedate capillary fill time.  There is no pain with calf compression, swelling, warmth, erythema.   Neruologic: Grossly intact via light touch bilateral.   Musculoskeletal: Majority tenderness today is along the course of the peroneal tendon posterior and inferior to the lateral malleolus.  Clinically the tendon appears to be intact.  Mild supple sinus tarsi.  There is no pain with ankle joint.  No pain in the plantar fascia, Achilles tendon.  No other areas of pinpoint tenderness.  Muscular strength 5/5 in all groups tested bilateral.  Gait: Unassisted, Nonantalgic.       Assessment:   Tendinitis, capsulitis left foot     Plan:  -Treatment options discussed including all alternatives, risks, and complications -Etiology of symptoms were discussed -X-rays were obtained and reviewed with the patient.  There is no evidence of acute fracture or stress fracture identified today. -Prescribed mobic. Discussed side effects of the medication and directed to stop if any are to occur and call the office.  -Cam boot -Exercise to feel better discussed treatment, rehab exercises.  She can start to transition to regular shoe as  tolerated.   -If no improvement recommend MRI.  Vivi Barrack DPM

## 2021-05-30 ENCOUNTER — Telehealth: Payer: Self-pay | Admitting: *Deleted

## 2021-05-30 MED ORDER — MELOXICAM 15 MG PO TABS: 15.0000 mg | ORAL_TABLET | Freq: Every day | ORAL | 0 refills | Status: DC

## 2021-05-30 NOTE — Telephone Encounter (Signed)
Patient's pharmacy [facility] [address] requested 90 days supply of meloxicam 15 mg and per Dr Ardelle Anton ok to refill and left a message for the patient. Misty Stanley

## 2021-05-30 NOTE — Addendum Note (Signed)
Addended by: Hadley Pen R on: 05/30/2021 01:33 PM   Modules accepted: Orders

## 2021-06-02 ENCOUNTER — Ambulatory Visit: Payer: Federal, State, Local not specified - PPO | Admitting: Podiatry

## 2021-06-02 ENCOUNTER — Other Ambulatory Visit: Payer: Self-pay

## 2021-06-02 DIAGNOSIS — M7672 Peroneal tendinitis, left leg: Secondary | ICD-10-CM

## 2021-06-02 NOTE — Patient Instructions (Signed)
For instructions on how to put on your Tri-Lock Ankle Brace, please visit [email or web address] 

## 2021-06-05 NOTE — Progress Notes (Signed)
Subjective: 40 year old female presents the office today for follow-up evaluation of left foot peroneal tendinitis.  She is in the cam boot she states that the pain is resolved and not having any significant pain.  She has gone back to exercise but has modified her activities to accommodate this.  No recent injury or changes otherwise.  Objective: AAO x3, NAD DP/PT pulses palpable bilaterally, CRT less than 3 seconds At this time unable to elicit any area of pinpoint tenderness.  There is no discomfort on the course the peroneal tendons along the plantar fascia.  No pain with Achilles tendon.  Flexor, extensor tendons appear to be intact.  MMT 5/5.  No pain with calf compression, swelling, warmth, erythema  Assessment: Peroneal tendinitis with improvement  Plan: -All treatment options discussed with the patient including all alternatives, risks, complications.  -Post, discussed transition to regular shoe as tolerated.  Exercises were discussed to help with her rehab.  Tri-Lock ankle brace was dispensed to use as needed as she is improving I like to wean off the brace as well.  Continue with shoes and good arch support. -Patient encouraged to call the office with any questions, concerns, change in symptoms.   Vivi Barrack DPM

## 2021-07-14 ENCOUNTER — Ambulatory Visit: Payer: Federal, State, Local not specified - PPO | Admitting: Podiatry

## 2022-03-05 DIAGNOSIS — E039 Hypothyroidism, unspecified: Secondary | ICD-10-CM | POA: Insufficient documentation

## 2022-03-05 DIAGNOSIS — J302 Other seasonal allergic rhinitis: Secondary | ICD-10-CM | POA: Insufficient documentation

## 2022-06-11 ENCOUNTER — Ambulatory Visit: Payer: Federal, State, Local not specified - PPO | Attending: Cardiology | Admitting: Cardiology

## 2022-06-11 ENCOUNTER — Encounter: Payer: Self-pay | Admitting: Cardiology

## 2022-06-11 VITALS — BP 150/106 | HR 92 | Ht 73.0 in | Wt >= 6400 oz

## 2022-06-11 DIAGNOSIS — R9431 Abnormal electrocardiogram [ECG] [EKG]: Secondary | ICD-10-CM | POA: Diagnosis not present

## 2022-06-11 DIAGNOSIS — R400 Somnolence: Secondary | ICD-10-CM | POA: Diagnosis not present

## 2022-06-11 DIAGNOSIS — Z7689 Persons encountering health services in other specified circumstances: Secondary | ICD-10-CM

## 2022-06-11 DIAGNOSIS — I119 Hypertensive heart disease without heart failure: Secondary | ICD-10-CM

## 2022-06-11 MED ORDER — AMLODIPINE BESYLATE 10 MG PO TABS: 10.0000 mg | ORAL_TABLET | Freq: Every day | ORAL | 3 refills | Status: DC

## 2022-06-11 NOTE — Patient Instructions (Signed)
Medication Instructions:  Your physician has recommended you make the following change in your medication:  INCREASE: Amlodipine 10mg  daily  *If you need a refill on your cardiac medications before your next appointment, please call your pharmacy*  Please take your blood pressure daily until TUESDAY and send in a MyChart message. Please include heart rates.   HOW TO TAKE YOUR BLOOD PRESSURE: Rest 5 minutes before taking your blood pressure. Don't smoke or drink caffeinated beverages for at least 30 minutes before. Take your blood pressure before (not after) you eat. Sit comfortably with your back supported and both feet on the floor (don't cross your legs). Elevate your arm to heart level on a table or a desk. Use the proper sized cuff. It should fit smoothly and snugly around your bare upper arm. There should be enough room to slip a fingertip under the cuff. The bottom edge of the cuff should be 1 inch above the crease of the elbow. Ideally, take 3 measurements at one sitting and record the average.    Lab Work: NONE If you have labs (blood work) drawn today and your tests are completely normal, you will receive your results only by: MyChart Message (if you have MyChart) OR A paper copy in the mail If you have any lab test that is abnormal or we need to change your treatment, we will call you to review the results.   Testing/Procedures: Your physician has requested that you have an echocardiogram. Echocardiography is a painless test that uses sound waves to create images of your heart. It provides your doctor with information about the size and shape of your heart and how well your heart's chambers and valves are working. This procedure takes approximately one hour. There are no restrictions for this procedure. Please do NOT wear cologne, perfume, aftershave, or lotions (deodorant is allowed). Please arrive 15 minutes prior to your appointment time.  Your physician has recommended  that you have a sleep study. This test records several body functions during sleep, including: brain activity, eye movement, oxygen and carbon dioxide blood levels, heart rate and rhythm, breathing rate and rhythm, the flow of air through your mouth and nose, snoring, body muscle movements, and chest and belly movement.     Follow-Up: At [facility], you and your health needs are our priority.  As part of our continuing mission to provide you with exceptional heart care, we have created designated Provider Care Teams.  These Care Teams include your primary Cardiologist (physician) and Advanced Practice Providers (APPs -  Physician Assistants and Nurse Practitioners) who all work together to provide you with the care you need, when you need it.  We recommend signing up for the patient portal called "MyChart".  Sign up information is provided on this After Visit Summary.  MyChart is used to connect with patients for Virtual Visits (Telemedicine).  Patients are able to view lab/test results, encounter notes, upcoming appointments, etc.  Non-urgent messages can be sent to your provider as well.   To learn more about what you can do with MyChart, go to [facility].    Your next appointment:   12 week(s)  The format for your next appointment:   In Person  Provider:   [email or web address], DO

## 2022-06-11 NOTE — Progress Notes (Signed)
Cardiology Office Note:    Date:  06/11/2022   ID:  [name], DOB 1980/10/08, MRN [id number]  PCP:  Andree Elk, FNP  Cardiologist:  Thomasene Ripple, DO  Electrophysiologist:  None   Referring MD: Maxie Better, MD   " I am ok"  History of Present Illness:    [name] is a 41 y.o. female with a hx of morbid obesity, hypertension which was diagnosed several years back, PCOS here today to be evaluated for hypertension.  She tells me that she has been following up with her primary care team as well as OB/GYN.  She has no specific complaints of chest pain shortness of breath.  She is concerned apprehensive given the fact that she has heart disease in her family.  Shared with me that she had been able to lose weight about 150 pounds several years ago.  But after COVID she gained all of this back.  She admits that she snacks often and usually using Oreos, cakes as well as chips.  Started actively cutting back on these things.  She actually goes to the gym at least twice a day.  Past Medical History:  Diagnosis Date   Elevated BP    Morbid obesity (HCC)    Pilonidal cyst with abscess     Past Surgical History:  Procedure Laterality Date   PILONIDAL CYST DRAINAGE     PILONIDAL CYST EXCISION      Current Medications: Current Meds  Medication Sig   amLODipine (NORVASC) 10 MG tablet Take 1 tablet (10 mg total) by mouth daily.   Cholecalciferol (VITAMIN D) 1000 UNITS capsule Take 1,000 Units by mouth daily.     fluconazole (DIFLUCAN) 200 MG tablet Take 200 mg by mouth daily. 30 day supply   hydrochlorothiazide (HYDRODIURIL) 25 MG tablet Take 25 mg by mouth daily.   ibuprofen (ADVIL) 800 MG tablet Take 800 mg by mouth every 8 (eight) hours as needed.   NP THYROID 120 MG tablet Take 120 mg by mouth every morning.   [DISCONTINUED] amLODipine (NORVASC) 5 MG tablet Take 5 mg by mouth daily.     Allergies:   Patient has no known allergies.   Social History    Socioeconomic History   Marital status: Single    Spouse name: Not on file   Number of children: 0   Years of education: Not on file   Highest education level: Not on file  Occupational History   Occupation: overnight International aid/development worker at [employer]    Employer: [employer]   Occupation: Therapist, sports at [employer]  Tobacco Use   Smoking status: Never   Smokeless tobacco: Never  Substance and Sexual Activity   Alcohol use: Yes    Comment: 1-2 drinks per month   Drug use: No   Sexual activity: Yes    Partners: Male    Birth control/protection: None  Other Topics Concern   Not on file  Social History Narrative   Single.  Lives with boyfriend   Social Determinants of Health   Financial Resource Strain: Not on file  Food Insecurity: Not on file  Transportation Needs: Not on file  Physical Activity: Not on file  Stress: Not on file  Social Connections: Not on file     Family History: The patient's family history includes Diabetes in her maternal grandmother and paternal grandmother; Healthy in her brother, sister, and sister; Heart disease in her maternal grandmother; Hyperlipidemia in her father; Hypertension in her father, maternal grandmother,  mother, and paternal grandmother; Kidney disease in her maternal grandmother. There is no history of Cancer.  ROS:   Review of Systems  Constitution: Negative for decreased appetite, fever and weight gain.  HENT: Negative for congestion, ear discharge, hoarse voice and sore throat.   Eyes: Negative for discharge, redness, vision loss in right eye and visual halos.  Cardiovascular: Negative for chest pain, dyspnea on exertion, leg swelling, orthopnea and palpitations.  Respiratory: Negative for cough, hemoptysis, shortness of breath and snoring.   Endocrine: Negative for heat intolerance and polyphagia.  Hematologic/Lymphatic: Negative for bleeding problem. Does not bruise/bleed easily.  Skin: Negative for flushing, nail  changes, rash and suspicious lesions.  Musculoskeletal: Negative for arthritis, joint pain, muscle cramps, myalgias, neck pain and stiffness.  Gastrointestinal: Negative for abdominal pain, bowel incontinence, diarrhea and excessive appetite.  Genitourinary: Negative for decreased libido, genital sores and incomplete emptying.  Neurological: Negative for brief paralysis, focal weakness, headaches and loss of balance.  Psychiatric/Behavioral: Negative for altered mental status, depression and suicidal ideas.  Allergic/Immunologic: Negative for HIV exposure and persistent infections.    EKGs/Labs/Other Studies Reviewed:    The following studies were reviewed today:   EKG:  The ekg ordered today demonstrates sinus rhythm, heart rate 92 bpm with P wave morphology suggesting left atrial enlargement.  Recent Labs: No results found for requested labs within last 365 days.  Recent Lipid Panel    Component Value Date/Time   CHOL 193 09/29/2012 1110   TRIG 74 09/29/2012 1110   HDL 58 09/29/2012 1110   CHOLHDL 3.3 09/29/2012 1110   VLDL 15 09/29/2012 1110   LDLCALC 120 (H) 09/29/2012 1110    Physical Exam:    VS:  BP (!) 150/106   Pulse 92   Ht 6\' 1"  (1.854 m)   Wt (!) 460 lb 3.2 oz (208.7 kg)   SpO2 98%   BMI 60.72 kg/m     Wt Readings from Last 3 Encounters:  06/11/22 (!) 460 lb 3.2 oz (208.7 kg)  04/09/13 (!) 446 lb 12.8 oz (202.7 kg)  09/29/12 (!) 440 lb (199.6 kg)     GEN: Well nourished, well developed in no acute distress HEENT: Normal NECK: No JVD; No carotid bruits LYMPHATICS: No lymphadenopathy CARDIAC: S1S2 noted,RRR, no murmurs, rubs, gallops RESPIRATORY:  Clear to auscultation without rales, wheezing or rhonchi  ABDOMEN: Soft, non-tender, non-distended, +bowel sounds, no guarding. EXTREMITIES: No edema, No cyanosis, no clubbing MUSCULOSKELETAL:  No deformity  SKIN: Warm and dry NEUROLOGIC:  Alert and oriented x 3, non-focal PSYCHIATRIC:  Normal affect, good  insight  ASSESSMENT:    1. Abnormal EKG   2. Encounter to establish care   3. Daytime somnolence   4. Morbid obesity (HCC)   5. Hypertensive heart disease without heart failure    PLAN:     She is hypertensive in the office today blood pressure also manually taken by me 148/100.  She is currently on amlodipine 5 mg daily will increase that to 10 mg daily.  Continue HCTZ 25 mg daily.  Given her abnormal EKG and longstanding hypertension I like to get an echocardiogram to assess for any structural abnormalities.  She does have some daytime somnolence with the hypertension will benefit from a sleep study suspect sleep apnea.  We discussed about diet modification heavily to help with the patient cutting back on salt as well as fact that she is prediabetic to come back on some carbs.  Will continue to exercise.  I need  to review her lipid panel.  Will request blood work from her PCP.  The patient is in agreement with the above plan. The patient left the office in stable condition.  The patient will follow up in   Medication Adjustments/Labs and Tests Ordered: Current medicines are reviewed at length with the patient today.  Concerns regarding medicines are outlined above.  Orders Placed This Encounter  Procedures   EKG 12-Lead   ECHOCARDIOGRAM COMPLETE   Split night study   Meds ordered this encounter  Medications   amLODipine (NORVASC) 10 MG tablet    Sig: Take 1 tablet (10 mg total) by mouth daily.    Dispense:  90 tablet    Refill:  3    Patient Instructions  Medication Instructions:  Your physician has recommended you make the following change in your medication:  INCREASE: Amlodipine 10mg  daily  *If you need a refill on your cardiac medications before your next appointment, please call your pharmacy*  Please take your blood pressure daily until TUESDAY and send in a MyChart message. Please include heart rates.   HOW TO TAKE YOUR BLOOD PRESSURE: Rest 5 minutes before  taking your blood pressure. Don't smoke or drink caffeinated beverages for at least 30 minutes before. Take your blood pressure before (not after) you eat. Sit comfortably with your back supported and both feet on the floor (don't cross your legs). Elevate your arm to heart level on a table or a desk. Use the proper sized cuff. It should fit smoothly and snugly around your bare upper arm. There should be enough room to slip a fingertip under the cuff. The bottom edge of the cuff should be 1 inch above the crease of the elbow. Ideally, take 3 measurements at one sitting and record the average.    Lab Work: NONE If you have labs (blood work) drawn today and your tests are completely normal, you will receive your results only by: MyChart Message (if you have MyChart) OR A paper copy in the mail If you have any lab test that is abnormal or we need to change your treatment, we will call you to review the results.   Testing/Procedures: Your physician has requested that you have an echocardiogram. Echocardiography is a painless test that uses sound waves to create images of your heart. It provides your doctor with information about the size and shape of your heart and how well your heart's chambers and valves are working. This procedure takes approximately one hour. There are no restrictions for this procedure. Please do NOT wear cologne, perfume, aftershave, or lotions (deodorant is allowed). Please arrive 15 minutes prior to your appointment time.  Your physician has recommended that you have a sleep study. This test records several body functions during sleep, including: brain activity, eye movement, oxygen and carbon dioxide blood levels, heart rate and rhythm, breathing rate and rhythm, the flow of air through your mouth and nose, snoring, body muscle movements, and chest and belly movement.     Follow-Up: At [facility], you and your health needs are our priority.  As part of our  continuing mission to provide you with exceptional heart care, we have created designated Provider Care Teams.  These Care Teams include your primary Cardiologist (physician) and Advanced Practice Providers (APPs -  Physician Assistants and Nurse Practitioners) who all work together to provide you with the care you need, when you need it.  We recommend signing up for the patient portal called "MyChart".  Sign up information is provided on this After Visit Summary.  MyChart is used to connect with patients for Virtual Visits (Telemedicine).  Patients are able to view lab/test results, encounter notes, upcoming appointments, etc.  Non-urgent messages can be sent to your provider as well.   To learn more about what you can do with MyChart, go to [email or web address].    Your next appointment:   12 week(s)  The format for your next appointment:   In Person  Provider:   Thomasene Ripple, DO     Adopting a Healthy Lifestyle.  Know what a healthy weight is for you (roughly BMI <25) and aim to maintain this   Aim for 7+ servings of fruits and vegetables daily   65-80+ fluid ounces of water or unsweet tea for healthy kidneys   Limit to max 1 drink of alcohol per day; avoid smoking/tobacco   Limit animal fats in diet for cholesterol and heart health - choose grass fed whenever available   Avoid highly processed foods, and foods high in saturated/trans fats   Aim for low stress - take time to unwind and care for your mental health   Aim for 150 min of moderate intensity exercise weekly for heart health, and weights twice weekly for bone health   Aim for 7-9 hours of sleep daily   When it comes to diets, agreement about the perfect plan isnt easy to find, even among the experts. Experts at the [facility] [employer] developed an idea known as the Healthy Eating Plate. Just imagine a plate divided into logical, healthy portions.   The emphasis is on diet quality:   Load up on  vegetables and fruits - one-half of your plate: Aim for color and variety, and remember that potatoes dont count.   Go for whole grains - one-quarter of your plate: Whole wheat, barley, wheat berries, quinoa, oats, brown rice, and foods made with them. If you want pasta, go with whole wheat pasta.   Protein power - one-quarter of your plate: Fish, chicken, beans, and nuts are all healthy, versatile protein sources. Limit red meat.   The diet, however, does go beyond the plate, offering a few other suggestions.   Use healthy plant oils, such as olive, canola, soy, corn, sunflower and peanut. Check the labels, and avoid partially hydrogenated oil, which have unhealthy trans fats.   If youre thirsty, drink water. Coffee and tea are good in moderation, but skip sugary drinks and limit milk and dairy products to one or two daily servings.   The type of carbohydrate in the diet is more important than the amount. Some sources of carbohydrates, such as vegetables, fruits, whole grains, and beans-are healthier than others.   Finally, stay active  Signed, Thomasene Ripple, DO  06/11/2022 10:03 AM    [facility]

## 2022-06-28 ENCOUNTER — Ambulatory Visit (HOSPITAL_BASED_OUTPATIENT_CLINIC_OR_DEPARTMENT_OTHER): Payer: Federal, State, Local not specified - PPO

## 2022-07-20 ENCOUNTER — Ambulatory Visit (HOSPITAL_COMMUNITY): Admission: RE | Admit: 2022-07-20 | Payer: Federal, State, Local not specified - PPO | Source: Ambulatory Visit

## 2022-07-27 ENCOUNTER — Ambulatory Visit (HOSPITAL_COMMUNITY)
Admission: RE | Admit: 2022-07-27 | Discharge: 2022-07-27 | Disposition: A | Discharge status: Home / Self Care | Payer: Federal, State, Local not specified - PPO | Source: Ambulatory Visit | Attending: Cardiology | Admitting: Cardiology

## 2022-07-27 DIAGNOSIS — R9431 Abnormal electrocardiogram [ECG] [EKG]: Secondary | ICD-10-CM | POA: Diagnosis present

## 2022-07-27 DIAGNOSIS — I10 Essential (primary) hypertension: Secondary | ICD-10-CM | POA: Insufficient documentation

## 2022-07-27 LAB — ECHOCARDIOGRAM COMPLETE
Area-P 1/2: 4.17 cm2
S' Lateral: 3 cm

## 2022-07-27 MED ORDER — PERFLUTREN LIPID MICROSPHERE
1.0000 mL | INTRAVENOUS | Status: AC | PRN
  Administered 2022-07-27: 3 mL via INTRAVENOUS
  Filled 2022-07-27: qty 10

## 2022-08-31 ENCOUNTER — Other Ambulatory Visit: Payer: Self-pay | Admitting: *Deleted

## 2022-08-31 DIAGNOSIS — R400 Somnolence: Secondary | ICD-10-CM

## 2022-09-07 ENCOUNTER — Ambulatory Visit: Payer: Federal, State, Local not specified - PPO | Admitting: Cardiology

## 2022-09-13 ENCOUNTER — Telehealth: Payer: Self-pay | Admitting: *Deleted

## 2022-09-13 NOTE — Telephone Encounter (Signed)
Received a fax from [facility] dates as 07/18/22 to 10/22/22. Sleep lab will be notified. Auth # remained the same.

## 2022-09-13 NOTE — Telephone Encounter (Signed)
Phoned [employer] to request PA extension for sleep study. Information was given to [facility], who informed me that he will send the message to the nurses for review and approval. New start date noted as 09/24/22. Previous Auth # [id number]. Which per Erlene Quan will be my reference # for this call.

## 2022-09-28 ENCOUNTER — Encounter: Payer: Self-pay | Admitting: Cardiology

## 2022-09-28 ENCOUNTER — Ambulatory Visit: Payer: Federal, State, Local not specified - PPO | Attending: Cardiology | Admitting: Cardiology

## 2022-09-28 VITALS — BP 160/92 | HR 101 | Ht 73.0 in | Wt >= 6400 oz

## 2022-09-28 DIAGNOSIS — I119 Hypertensive heart disease without heart failure: Secondary | ICD-10-CM | POA: Diagnosis not present

## 2022-09-28 DIAGNOSIS — I517 Cardiomegaly: Secondary | ICD-10-CM

## 2022-09-28 MED ORDER — VALSARTAN 40 MG PO TABS: 40.0000 mg | ORAL_TABLET | Freq: Every day | ORAL | 3 refills | Status: DC

## 2022-09-28 NOTE — Patient Instructions (Addendum)
Medication Instructions:  Your physician has recommended you make the following change in your medication:  START: Valsartan 40 mg once daily  Please take your blood pressure daily for 2 weeks and send in a MyChart message. Please include heart rates.   HOW TO TAKE YOUR BLOOD PRESSURE: Rest 5 minutes before taking your blood pressure. Don't smoke or drink caffeinated beverages for at least 30 minutes before. Take your blood pressure before (not after) you eat. Sit comfortably with your back supported and both feet on the floor (don't cross your legs). Elevate your arm to heart level on a table or a desk. Use the proper sized cuff. It should fit smoothly and snugly around your bare upper arm. There should be enough room to slip a fingertip under the cuff. The bottom edge of the cuff should be 1 inch above the crease of the elbow. Ideally, take 3 measurements at one sitting and record the average.  *If you need a refill on your cardiac medications before your next appointment, please call your pharmacy*   Lab Work: None  Testing/Procedures: None   Follow-Up: At [facility], you and your health needs are our priority.  As part of our continuing mission to provide you with exceptional heart care, we have created designated Provider Care Teams.  These Care Teams include your primary Cardiologist (physician) and Advanced Practice Providers (APPs -  Physician Assistants and Nurse Practitioners) who all work together to provide you with the care you need, when you need it.  Your next appointment:   4 months  Provider:   Thomasene Ripple, DO

## 2022-09-28 NOTE — Progress Notes (Signed)
Cardiology Office Note:    Date:  09/28/2022   ID:  [name], DOB 08/09/1980, MRN [id number]  PCP:  Andree ElkAddison, Jenny A, FNP  Cardiologist:  Thomasene RippleKardie Cherre Kothari, DO  Electrophysiologist:  None   Referring MD: Andree ElkAddison, Jenny A, FNP   " I am ok"  History of Present Illness:    [name] is a 42 y.o. female with a hx of morbid obesity, hypertension which was diagnosed several years back, PCOS here today for a follow up visit.  At her last visit she was hypertensive, at visit I increased her Amlodipine to 10 mg daily and continue the HCTZ 25 mg daily. Ordered on echo and sleep study.   She tells me today she has been working on her diet and has been accountable with by her trainer.  No complaints at this time.  Past Medical History:  Diagnosis Date   Elevated BP    Morbid obesity    Pilonidal cyst with abscess     Past Surgical History:  Procedure Laterality Date   PILONIDAL CYST DRAINAGE     PILONIDAL CYST EXCISION      Current Medications: Current Meds  Medication Sig   amLODipine (NORVASC) 10 MG tablet Take 1 tablet (10 mg total) by mouth daily.   Cholecalciferol (VITAMIN D) 1000 UNITS capsule Take 1,000 Units by mouth daily.     hydrochlorothiazide (HYDRODIURIL) 25 MG tablet Take 25 mg by mouth daily.   ibuprofen (ADVIL) 800 MG tablet Take 800 mg by mouth every 8 (eight) hours as needed.   NP THYROID 120 MG tablet Take 120 mg by mouth every morning.   [DISCONTINUED] valsartan (DIOVAN) 40 MG tablet Take 1 tablet (40 mg total) by mouth daily.     Allergies:   Patient has no known allergies.   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  Lives with boyfriend   Social Determinants of Health   Financial Resource Strain: Not on file  Food Insecurity: Not on file  Transportation Needs: Not on file  Physical Activity: Not on file  Stress: Not on file  Social Connections: Not on file     Family History: The patient's family history includes Diabetes in her maternal grandmother and paternal grandmother; Healthy in her brother, sister, and sister; Heart disease in her maternal grandmother; Hyperlipidemia in her father; Hypertension in her father, maternal grandmother, mother, and paternal grandmother; Kidney disease in her maternal grandmother. There is no history of Cancer.  ROS:   Review of Systems  Constitution: Negative for decreased appetite, fever and weight gain.  HENT: Negative for congestion, ear discharge, hoarse voice and sore throat.   Eyes: Negative for discharge, redness, vision loss in right eye and visual halos.  Cardiovascular: Negative for chest pain,  dyspnea on exertion, leg swelling, orthopnea and palpitations.  Respiratory: Negative for cough, hemoptysis, shortness of breath and snoring.   Endocrine: Negative for heat intolerance and polyphagia.  Hematologic/Lymphatic: Negative for bleeding problem. Does not bruise/bleed easily.  Skin: Negative for flushing, nail changes, rash and suspicious lesions.  Musculoskeletal: Negative for arthritis, joint pain, muscle cramps, myalgias, neck pain and stiffness.  Gastrointestinal: Negative for abdominal pain, bowel incontinence, diarrhea and excessive appetite.  Genitourinary: Negative for decreased libido, genital sores and incomplete emptying.  Neurological: Negative for  brief paralysis, focal weakness, headaches and loss of balance.  Psychiatric/Behavioral: Negative for altered mental status, depression and suicidal ideas.  Allergic/Immunologic: Negative for HIV exposure and persistent infections.    EKGs/Labs/Other Studies Reviewed:    The following studies were reviewed today:   EKG:  The ekg ordered today demonstrates sinus rhythm, heart rate 92 bpm with P wave morphology suggesting left atrial enlargement.  Tte 07/27/2022 IMPRESSIONS    1. Left ventricular ejection fraction, by estimation, is 60 to 65%. The  left ventricle has normal function. The left ventricle has no regional  wall motion abnormalities. There is mild left ventricular hypertrophy.  Left ventricular diastolic parameters  were normal.   2. Right ventricular systolic function is normal. The right ventricular  size is normal. Tricuspid regurgitation signal is inadequate for assessing  PA pressure.   3. The mitral valve is normal in structure. No evidence of mitral valve  regurgitation. No evidence of mitral stenosis.   4. The aortic valve was not well visualized. Aortic valve regurgitation  is not visualized. No aortic stenosis is present.   5. The inferior vena cava is normal in size with greater than 50%  respiratory variability, suggesting right atrial pressure of 3 mmHg.   FINDINGS   Left Ventricle: Left ventricular ejection fraction, by estimation, is 60  to 65%. The left ventricle has normal function. The left ventricle has no  regional wall motion abnormalities. Definity contrast agent was given IV  to delineate the left ventricular   endocardial borders. The left ventricular internal cavity size was normal  in size. There is mild left ventricular hypertrophy. Left ventricular  diastolic parameters were normal.   Right Ventricle: The right ventricular size is normal. No increase in  right ventricular wall thickness. Right ventricular systolic function is  normal.  Tricuspid regurgitation signal is inadequate for assessing PA  pressure.   Left Atrium: Left atrial size was normal in size.   Right Atrium: Right atrial size was normal in size.   Pericardium: There is no evidence of pericardial effusion. Presence of  epicardial fat layer.   Mitral Valve: The mitral valve is normal in structure. No evidence of  mitral valve regurgitation. No evidence of mitral valve stenosis.   Tricuspid Valve: The tricuspid valve is normal in structure. Tricuspid  valve regurgitation is not demonstrated. No evidence of tricuspid  stenosis.   Aortic Valve: The aortic valve was not well visualized. Aortic valve  regurgitation is not visualized. No aortic stenosis is present.   Pulmonic Valve: The pulmonic valve was normal in structure. Pulmonic valve  regurgitation is trivial. No evidence of pulmonic stenosis.   Aorta: The aortic root is normal in size and structure.   Venous: The inferior vena cava is normal in size with greater than 50%  respiratory variability, suggesting right atrial pressure of 3 mmHg.   IAS/Shunts: No atrial level shunt detected by color flow Doppler.    Recent Labs: No results found  for requested labs within last 365 days.  Recent Lipid Panel    Component Value Date/Time   CHOL 193 09/29/2012 1110   TRIG 74 09/29/2012 1110   HDL 58 09/29/2012 1110   CHOLHDL 3.3 09/29/2012 1110   VLDL 15 09/29/2012 1110   LDLCALC 120 (H) 09/29/2012 1110    Physical Exam:    VS:  BP 134/88   Pulse (!) 101   Ht 6\' 1"  (1.854 m)   Wt (!) 462 lb 6.4 oz (209.7 kg)   SpO2 98%   BMI 61.01 kg/m     Wt Readings from Last 3 Encounters:  09/28/22 (!) 462 lb 6.4 oz (209.7 kg)  06/11/22 (!) 460 lb 3.2 oz (208.7 kg)  04/09/13 (!) 446 lb 12.8 oz (202.7 kg)     GEN: Well nourished, well developed in no acute distress HEENT: Normal NECK: No JVD; No carotid bruits LYMPHATICS: No lymphadenopathy CARDIAC: S1S2 noted,RRR, no murmurs, rubs,  gallops RESPIRATORY:  Clear to auscultation without rales, wheezing or rhonchi  ABDOMEN: Soft, non-tender, non-distended, +bowel sounds, no guarding. EXTREMITIES: No edema, No cyanosis, no clubbing MUSCULOSKELETAL:  No deformity  SKIN: Warm and dry NEUROLOGIC:  Alert and oriented x 3, non-focal PSYCHIATRIC:  Normal affect, good insight  ASSESSMENT:    1. Hypertensive heart disease without heart failure   2. LVH (left ventricular hypertrophy)   3. Morbid obesity     PLAN:    Still hypertensive in the office today - add valsartan 40 mg daily, continue Amlodipine and HCTZ.  We discussed about diet modification heavily to help with the patient cutting back on salt as well as fact that she is prediabetic to come back on some carbs.  Will continue to exercise.  The patient is in agreement with the above plan. The patient left the office in stable condition.  The patient will follow up in 4 months or sooner if needed.   Medication Adjustments/Labs and Tests Ordered: Current medicines are reviewed at length with the patient today.  Concerns regarding medicines are outlined above.  No orders of the defined types were placed in this encounter.  Meds ordered this encounter  Medications   DISCONTD: valsartan (DIOVAN) 40 MG tablet    Sig: Take 1 tablet (40 mg total) by mouth daily.    Dispense:  90 tablet    Refill:  3   valsartan (DIOVAN) 40 MG tablet    Sig: Take 1 tablet (40 mg total) by mouth daily.    Dispense:  90 tablet    Refill:  3    Patient Instructions  Medication Instructions:  Your physician has recommended you make the following change in your medication:  START: Valsartan 40 mg once daily  Please take your blood pressure daily for 2 weeks and send in a MyChart message. Please include heart rates.   HOW TO TAKE YOUR BLOOD PRESSURE: Rest 5 minutes before taking your blood pressure. Don't smoke or drink caffeinated beverages for at least 30 minutes before. Take  your blood pressure before (not after) you eat. Sit comfortably with your back supported and both feet on the floor (don't cross your legs). Elevate your arm to heart level on a table or a desk. Use the proper sized cuff. It should fit smoothly and snugly around your bare upper arm. There should be enough room to slip a fingertip under the cuff. The bottom edge of the cuff should be 1 inch above the crease of the elbow. Ideally, take 3  measurements at one sitting and record the average.  *If you need a refill on your cardiac medications before your next appointment, please call your pharmacy*   Lab Work: None  Testing/Procedures: None   Follow-Up: At [facility], you and your health needs are our priority.  As part of our continuing mission to provide you with exceptional heart care, we have created designated Provider Care Teams.  These Care Teams include your primary Cardiologist (physician) and Advanced Practice Providers (APPs -  Physician Assistants and Nurse Practitioners) who all work together to provide you with the care you need, when you need it.  Your next appointment:   4 months  Provider:   Thomasene RippleKardie Elias Dennington, DO     Adopting a Healthy Lifestyle.  Know what a healthy weight is for you (roughly BMI <25) and aim to maintain this   Aim for 7+ servings of fruits and vegetables daily   65-80+ fluid ounces of water or unsweet tea for healthy kidneys   Limit to max 1 drink of alcohol per day; avoid smoking/tobacco   Limit animal fats in diet for cholesterol and heart health - choose grass fed whenever available   Avoid highly processed foods, and foods high in saturated/trans fats   Aim for low stress - take time to unwind and care for your mental health   Aim for 150 min of moderate intensity exercise weekly for heart health, and weights twice weekly for bone health   Aim for 7-9 hours of sleep daily   When it comes to diets, agreement about the perfect plan  isnt easy to find, even among the experts. Experts at the [employer] developed an idea known as the Healthy Eating Plate. Just imagine a plate divided into logical, healthy portions.   The emphasis is on diet quality:   Load up on vegetables and fruits - one-half of your plate: Aim for color and variety, and remember that potatoes dont count.   Go for whole grains - one-quarter of your plate: Whole wheat, barley, wheat berries, quinoa, oats, brown rice, and foods made with them. If you want pasta, go with whole wheat pasta.   Protein power - one-quarter of your plate: Fish, chicken, beans, and nuts are all healthy, versatile protein sources. Limit red meat.   The diet, however, does go beyond the plate, offering a few other suggestions.   Use healthy plant oils, such as olive, canola, soy, corn, sunflower and peanut. Check the labels, and avoid partially hydrogenated oil, which have unhealthy trans fats.   If youre thirsty, drink water. Coffee and tea are good in moderation, but skip sugary drinks and limit milk and dairy products to one or two daily servings.   The type of carbohydrate in the diet is more important than the amount. Some sources of carbohydrates, such as vegetables, fruits, whole grains, and beans-are healthier than others.   Finally, stay active  Signed, Thomasene RippleKardie Samra Pesch, DO  09/28/2022 11:23 PM    [facility]

## 2022-10-12 ENCOUNTER — Ambulatory Visit (HOSPITAL_BASED_OUTPATIENT_CLINIC_OR_DEPARTMENT_OTHER): Payer: Federal, State, Local not specified - PPO | Attending: Cardiology | Admitting: Cardiovascular Disease

## 2022-10-12 VITALS — Ht 73.0 in | Wt >= 6400 oz

## 2022-10-12 DIAGNOSIS — G4736 Sleep related hypoventilation in conditions classified elsewhere: Secondary | ICD-10-CM | POA: Diagnosis not present

## 2022-10-12 DIAGNOSIS — G4730 Sleep apnea, unspecified: Secondary | ICD-10-CM

## 2022-10-12 DIAGNOSIS — R0683 Snoring: Secondary | ICD-10-CM | POA: Diagnosis not present

## 2022-10-12 DIAGNOSIS — R400 Somnolence: Secondary | ICD-10-CM | POA: Insufficient documentation

## 2022-10-24 ENCOUNTER — Encounter: Payer: Self-pay | Admitting: Cardiology

## 2022-10-28 ENCOUNTER — Encounter (HOSPITAL_BASED_OUTPATIENT_CLINIC_OR_DEPARTMENT_OTHER): Payer: Self-pay | Admitting: Cardiovascular Disease

## 2022-10-28 NOTE — Procedures (Signed)
     Patient Name: [name], [name] Date: 10/12/2022 Gender: Female D.O.B: 1981/03/20 Age (years): 41 Referring Provider: Lavona Mound Tobb DO Height (inches): 73 Interpreting Physician: Nicki Guadalajara MD, ABSM Weight (lbs): 450 RPSGT: Lowry Ram BMI: 59 MRN: [id number] Neck Size: 17.00  CLINICAL INFORMATION Sleep Study Type: NPSG  Indication for sleep study: Fatigue, Hypertension, Obesity  Epworth Sleepiness Score: 3  SLEEP STUDY TECHNIQUE As per the AASM Manual for the Scoring of Sleep and Associated Events v2.3 (April 2016) with a hypopnea requiring 4% desaturations.  The channels recorded and monitored were frontal, central and occipital EEG, electrooculogram (EOG), submentalis EMG (chin), nasal and oral airflow, thoracic and abdominal wall motion, anterior tibialis EMG, snore microphone, electrocardiogram, and pulse oximetry.  MEDICATIONS amLODipine (NORVASC) 10 MG tablet Cholecalciferol (VITAMIN D) 1000 UNITS capsule hydrochlorothiazide (HYDRODIURIL) 25 MG tablet ibuprofen (ADVIL) 800 MG tablet NP THYROID 120 MG tablet valsartan (DIOVAN) 40 MG tablet Medications self-administered by patient taken the night of the study : N/A  SLEEP ARCHITECTURE The study was initiated at 10:30:52 PM and ended at 5:23:27 AM.  Sleep onset time was 2.9 minutes and the sleep efficiency was 94.8%. The total sleep time was 391 minutes.  Stage REM latency was 119.5 minutes.  The patient spent 2.2% of the night in stage N1 sleep, 75.3% in stage N2 sleep, 0.0% in stage N3 and 22.5% in REM.  Alpha intrusion was absent.  Supine sleep was 56.27%.  RESPIRATORY PARAMETERS The overall apnea/hypopnea index (AHI) was 2.8 per hour. The respiratory disturbance index (RDI) was 3.5/h. There were 10 total apneas, including 9 obstructive, 1 central and 0 mixed apneas. There were 8 hypopneas and 5 RERAs.  The AHI during Stage REM sleep was 11.6 per hour.  AHI while supine was 3.0 per  hour.  The mean oxygen saturation was 95.1%. The minimum SpO2 during sleep was 87.0%.  Moderate snoring was noted during this study.  CARDIAC DATA The 2 lead EKG demonstrated sinus rhythm. The mean heart rate was 67.8 beats per minute. Other EKG findings include: None.  LEG MOVEMENT DATA The total PLMS were 0 with a resulting PLMS index of 0.0. Associated arousal with leg movement index was 0.3 .  IMPRESSIONS - No significant obstructive sleep apnea overall (AHI 2.8/h; RDI 3.5/h); however, mild sleep apnea is present during REM sleep. (AHI 11.6/h). - No significant central sleep apnea occurred during this study (CAI 0.2/h). - Mild oxygen desaturation to a nadir of 87.0%. - The patient snored with moderate snoring volume. - No cardiac abnormalities were noted during this study. - Clinically significant periodic limb movements did not occur during sleep. No significant associated arousals.  DIAGNOSIS - Nocturnal Hypoxemia (G47.36) - Upper Airway Resistance (UARS) - SLeep Apnea , unspecified (G47.30)  RECOMMENDATIONS - Patient does not meet criteria for CPAP. - Effort should be made to optimize nasal and oropharyngeal patency. - Consider alternatives for the treatment of snoring. - Avoid alcohol, sedatives and other CNS depressants that may worsen sleep apnea and disrupt normal sleep architecture. - Sleep hygiene should be reviewed to assess factors that may improve sleep quality. - Weight management (BMI 59) and regular exercise should be initiated or continued if appropriate.  [Electronically signed] 10/28/2022 10:17 PM  Nicki Guadalajara MD, [facility], ABSM Diplomate, American Board of Sleep Medicine  NPI: [id number]  [facility] PH: [phone]   FX: [phone] ACCREDITED BY THE AMERICAN ACADEMY OF SLEEP MEDICINE

## 2022-10-31 ENCOUNTER — Telehealth: Payer: Self-pay | Admitting: *Deleted

## 2022-10-31 NOTE — Telephone Encounter (Signed)
The patient has been notified of the result and verbalized understanding.  All questions (if any) were answered. [name], CMA 10/31/2022 3:06 PM     Pt is aware and agreeable to results. Patient does not meet criteria for CPAP.

## 2022-10-31 NOTE — Telephone Encounter (Signed)
-----   Message from Lennette Bihari, MD sent at 10/28/2022 10:21 PM EDT ----- [name], please notify pt of the results.

## 2023-02-01 ENCOUNTER — Encounter: Payer: Self-pay | Admitting: Cardiology

## 2023-02-01 ENCOUNTER — Ambulatory Visit: Payer: Federal, State, Local not specified - PPO | Admitting: Cardiology

## 2023-02-01 VITALS — BP 118/78 | HR 91 | Ht 73.0 in | Wt >= 6400 oz

## 2023-02-01 DIAGNOSIS — I119 Hypertensive heart disease without heart failure: Secondary | ICD-10-CM

## 2023-02-01 MED ORDER — OLMESARTAN-AMLODIPINE-HCTZ 40-10-12.5 MG PO TABS: 1.0000 | ORAL_TABLET | Freq: Every day | ORAL | 3 refills | Status: DC

## 2023-02-01 NOTE — Progress Notes (Signed)
Cardiology Office Note:    Date:  02/01/2023   ID:  [name], DOB 20-Feb-1981, MRN [id number]  PCP:  Andree Elk, FNP  Cardiologist:  Thomasene Ripple, DO  Electrophysiologist:  None   Referring MD: Andree Elk, FNP   " I am ok"  History of Present Illness:    [name] is a 42 y.o. female with a hx of morbid obesity, hypertension which was diagnosed several years back, PCOS here today for a follow up visit.  At her last visit blood pressure was elevated I added valsartan to her regimen. She has tolerated this medication. She recently celebrated her 28th birthday. She is working to loose weight.   No complaints at this time.  Past Medical History:  Diagnosis Date   Elevated BP    Morbid obesity (HCC)    Pilonidal cyst with abscess     Past Surgical History:  Procedure Laterality Date   PILONIDAL CYST DRAINAGE     PILONIDAL CYST EXCISION      Current Medications: Current Meds  Medication Sig   Cholecalciferol (VITAMIN D) 1000 UNITS capsule Take 1,000 Units by mouth daily.     ibuprofen (ADVIL) 800 MG tablet Take 800 mg by mouth every 8 (eight) hours as needed.   NP THYROID 120 MG tablet Take 120 mg by mouth every morning.   Olmesartan-amLODIPine-HCTZ 40-10-12.5 MG TABS Take 1 tablet by mouth daily.   RYBELSUS 3 MG TABS Take by mouth daily.   [DISCONTINUED] amLODipine (NORVASC) 10 MG tablet Take 1 tablet (10 mg total) by mouth daily.   [DISCONTINUED] hydrochlorothiazide (HYDRODIURIL) 25 MG tablet Take 25 mg by mouth daily.   [DISCONTINUED] valsartan (DIOVAN) 40 MG tablet Take 1 tablet (40 mg total) by mouth daily.     Allergies:   Patient has no known allergies.   Social History   Socioeconomic History   Marital status: Single    Spouse name: Not on file   Number of children: 0   Years of education: Not on file   Highest education level: Not on file  Occupational History   Occupation: overnight International aid/development worker at [employer]    Employer:  [employer]   Occupation: Therapist, sports at [employer]  Tobacco Use   Smoking status: Never   Smokeless tobacco: Never  Substance and Sexual Activity   Alcohol use: Yes    Comment: 1-2 drinks per month   Drug use: No   Sexual activity: Yes    Partners: Male    Birth control/protection: None  Other Topics Concern   Not on file  Social History Narrative   Single.  Lives with boyfriend   Social Determinants of Health   Financial Resource Strain: Not on file  Food Insecurity: No Food Insecurity (03/05/2022)   Received from [facility]   Hunger Vital Sign    Worried About Running Out of Food in the Last Year: Never true    Ran Out of Food in the Last Year: Never true  Transportation Needs: Not on file  Physical Activity: Not on file  Stress: Not on file  Social Connections: Unknown (12/04/2021)   Received from [facility]   Social Network    Social Network: Not on file     Family History: The patient's family history includes Diabetes in her maternal grandmother and paternal grandmother; Healthy in her brother, sister, and sister; Heart disease in her maternal grandmother; Hyperlipidemia in her father; Hypertension in her father, maternal grandmother, mother, and paternal  grandmother; Kidney disease in her maternal grandmother. There is no history of Cancer.  ROS:   Review of Systems  Constitution: Negative for decreased appetite, fever and weight gain.  HENT: Negative for congestion, ear discharge, hoarse voice and sore throat.   Eyes: Negative for discharge, redness, vision loss in right eye and visual halos.  Cardiovascular: Negative for chest pain, dyspnea on exertion, leg swelling, orthopnea and palpitations.  Respiratory: Negative for cough, hemoptysis, shortness of breath and snoring.   Endocrine: Negative for heat intolerance and polyphagia.  Hematologic/Lymphatic: Negative for bleeding problem. Does not bruise/bleed easily.  Skin: Negative for flushing,  nail changes, rash and suspicious lesions.  Musculoskeletal: Negative for arthritis, joint pain, muscle cramps, myalgias, neck pain and stiffness.  Gastrointestinal: Negative for abdominal pain, bowel incontinence, diarrhea and excessive appetite.  Genitourinary: Negative for decreased libido, genital sores and incomplete emptying.  Neurological: Negative for brief paralysis, focal weakness, headaches and loss of balance.  Psychiatric/Behavioral: Negative for altered mental status, depression and suicidal ideas.  Allergic/Immunologic: Negative for HIV exposure and persistent infections.    EKGs/Labs/Other Studies Reviewed:    The following studies were reviewed today:   EKG:  The ekg ordered today demonstrates sinus rhythm, heart rate 92 bpm with P wave morphology suggesting left atrial enlargement.  Tte 07/27/2022 IMPRESSIONS    1. Left ventricular ejection fraction, by estimation, is 60 to 65%. The  left ventricle has normal function. The left ventricle has no regional  wall motion abnormalities. There is mild left ventricular hypertrophy.  Left ventricular diastolic parameters  were normal.   2. Right ventricular systolic function is normal. The right ventricular  size is normal. Tricuspid regurgitation signal is inadequate for assessing  PA pressure.   3. The mitral valve is normal in structure. No evidence of mitral valve  regurgitation. No evidence of mitral stenosis.   4. The aortic valve was not well visualized. Aortic valve regurgitation  is not visualized. No aortic stenosis is present.   5. The inferior vena cava is normal in size with greater than 50%  respiratory variability, suggesting right atrial pressure of 3 mmHg.   FINDINGS   Left Ventricle: Left ventricular ejection fraction, by estimation, is 60  to 65%. The left ventricle has normal function. The left ventricle has no  regional wall motion abnormalities. Definity contrast agent was given IV  to delineate the  left ventricular   endocardial borders. The left ventricular internal cavity size was normal  in size. There is mild left ventricular hypertrophy. Left ventricular  diastolic parameters were normal.   Right Ventricle: The right ventricular size is normal. No increase in  right ventricular wall thickness. Right ventricular systolic function is  normal. Tricuspid regurgitation signal is inadequate for assessing PA  pressure.   Left Atrium: Left atrial size was normal in size.   Right Atrium: Right atrial size was normal in size.   Pericardium: There is no evidence of pericardial effusion. Presence of  epicardial fat layer.   Mitral Valve: The mitral valve is normal in structure. No evidence of  mitral valve regurgitation. No evidence of mitral valve stenosis.   Tricuspid Valve: The tricuspid valve is normal in structure. Tricuspid  valve regurgitation is not demonstrated. No evidence of tricuspid  stenosis.   Aortic Valve: The aortic valve was not well visualized. Aortic valve  regurgitation is not visualized. No aortic stenosis is present.   Pulmonic Valve: The pulmonic valve was normal in structure. Pulmonic valve  regurgitation is  trivial. No evidence of pulmonic stenosis.   Aorta: The aortic root is normal in size and structure.   Venous: The inferior vena cava is normal in size with greater than 50%  respiratory variability, suggesting right atrial pressure of 3 mmHg.   IAS/Shunts: No atrial level shunt detected by color flow Doppler.    Recent Labs: No results found for requested labs within last 365 days.  Recent Lipid Panel    Component Value Date/Time   CHOL 193 09/29/2012 1110   TRIG 74 09/29/2012 1110   HDL 58 09/29/2012 1110   CHOLHDL 3.3 09/29/2012 1110   VLDL 15 09/29/2012 1110   LDLCALC 120 (H) 09/29/2012 1110    Physical Exam:    VS:  BP 118/78   Pulse 91   Ht 6\' 1"  (1.854 m)   Wt (!) 461 lb 6.4 oz (209.3 kg)   LMP 01/20/2023   SpO2 95%   BMI  60.87 kg/m     Wt Readings from Last 3 Encounters:  02/01/23 (!) 461 lb 6.4 oz (209.3 kg)  10/12/22 (!) 450 lb (204.1 kg)  09/28/22 (!) 462 lb 6.4 oz (209.7 kg)     GEN: Well nourished, well developed in no acute distress HEENT: Normal NECK: No JVD; No carotid bruits LYMPHATICS: No lymphadenopathy CARDIAC: S1S2 noted,RRR, no murmurs, rubs, gallops RESPIRATORY:  Clear to auscultation without rales, wheezing or rhonchi  ABDOMEN: Soft, non-tender, non-distended, +bowel sounds, no guarding. EXTREMITIES: No edema, No cyanosis, no clubbing MUSCULOSKELETAL:  No deformity  SKIN: Warm and dry NEUROLOGIC:  Alert and oriented x 3, non-focal PSYCHIATRIC:  Normal affect, good insight  ASSESSMENT:    1. Hypertensive heart disease without heart failure   2. Morbid obesity (HCC)     PLAN:    Her blood pressure is at target. She asked about getting off the medication, I explained to her why this will not be good idea.  She will going her medications: I will transition from valsartan to olmesartan. She will now be on a combinations pill ( Olmesartan 40mg  - Amlodipine 10 mg - hydrochlorothiazide 12.5 mg)  Will continue to exercise.  The patient is in agreement with the above plan. The patient left the office in stable condition.  The patient will follow up in 4 months or sooner if needed.   Medication Adjustments/Labs and Tests Ordered: Current medicines are reviewed at length with the patient today.  Concerns regarding medicines are outlined above.  Orders Placed This Encounter  Procedures   EKG 12-Lead   Meds ordered this encounter  Medications   Olmesartan-amLODIPine-HCTZ 40-10-12.5 MG TABS    Sig: Take 1 tablet by mouth daily.    Dispense:  90 tablet    Refill:  3    Patient Instructions  Medication Instructions:  Your physician has recommended you make the following change in your medication:  STOP: Valsartan  STOP: Amlodipine STOP: Hydrochlorothiazide START:  Olmesartan-Amlodipine-hydrochlorothiazide 40-10-12.5 mg once daily *If you need a refill on your cardiac medications before your next appointment, please call your pharmacy*   Lab Work: None   Testing/Procedures: None   Follow-Up: At [facility], you and your health needs are our priority.  As part of our continuing mission to provide you with exceptional heart care, we have created designated Provider Care Teams.  These Care Teams include your primary Cardiologist (physician) and Advanced Practice Providers (APPs -  Physician Assistants and Nurse Practitioners) who all work together to provide you with the care you need,  when you need it.  Your next appointment:   9 month(s)  Provider:   Thomasene Ripple, DO         Adopting a Healthy Lifestyle.  Know what a healthy weight is for you (roughly BMI <25) and aim to maintain this   Aim for 7+ servings of fruits and vegetables daily   65-80+ fluid ounces of water or unsweet tea for healthy kidneys   Limit to max 1 drink of alcohol per day; avoid smoking/tobacco   Limit animal fats in diet for cholesterol and heart health - choose grass fed whenever available   Avoid highly processed foods, and foods high in saturated/trans fats   Aim for low stress - take time to unwind and care for your mental health   Aim for 150 min of moderate intensity exercise weekly for heart health, and weights twice weekly for bone health   Aim for 7-9 hours of sleep daily   When it comes to diets, agreement about the perfect plan isnt easy to find, even among the experts. Experts at the [facility] developed an idea known as the Healthy Eating Plate. Just imagine a plate divided into logical, healthy portions.   The emphasis is on diet quality:   Load up on vegetables and fruits - one-half of your plate: Aim for color and variety, and remember that potatoes dont count.   Go for whole grains - one-quarter of your  plate: Whole wheat, barley, wheat berries, quinoa, oats, brown rice, and foods made with them. If you want pasta, go with whole wheat pasta.   Protein power - one-quarter of your plate: Fish, chicken, beans, and nuts are all healthy, versatile protein sources. Limit red meat.   The diet, however, does go beyond the plate, offering a few other suggestions.   Use healthy plant oils, such as olive, canola, soy, corn, sunflower and peanut. Check the labels, and avoid partially hydrogenated oil, which have unhealthy trans fats.   If youre thirsty, drink water. Coffee and tea are good in moderation, but skip sugary drinks and limit milk and dairy products to one or two daily servings.   The type of carbohydrate in the diet is more important than the amount. Some sources of carbohydrates, such as vegetables, fruits, whole grains, and beans-are healthier than others.   Finally, stay active  Signed, Thomasene Ripple, DO  02/01/2023 8:11 PM    [facility]

## 2023-02-01 NOTE — Patient Instructions (Addendum)
Medication Instructions:  Your physician has recommended you make the following change in your medication:  STOP: Valsartan  STOP: Amlodipine STOP: Hydrochlorothiazide START: Olmesartan-Amlodipine-hydrochlorothiazide 40-10-12.5 mg once daily *If you need a refill on your cardiac medications before your next appointment, please call your pharmacy*   Lab Work: None   Testing/Procedures: None   Follow-Up: At [facility], you and your health needs are our priority.  As part of our continuing mission to provide you with exceptional heart care, we have created designated Provider Care Teams.  These Care Teams include your primary Cardiologist (physician) and Advanced Practice Providers (APPs -  Physician Assistants and Nurse Practitioners) who all work together to provide you with the care you need, when you need it.  Your next appointment:   9 month(s)  Provider:   Thomasene Ripple, DO

## 2023-06-12 LAB — RESULTS CONSOLE HPV: CHL HPV: NEGATIVE

## 2023-06-12 LAB — HM PAP SMEAR

## 2023-10-24 ENCOUNTER — Ambulatory Visit: Payer: Federal, State, Local not specified - PPO | Admitting: Nurse Practitioner

## 2023-10-24 ENCOUNTER — Encounter: Payer: Self-pay | Admitting: Nurse Practitioner

## 2023-10-24 VITALS — BP 134/82 | HR 84 | Temp 98.4°F | Ht 73.0 in | Wt >= 6400 oz

## 2023-10-24 DIAGNOSIS — R1032 Left lower quadrant pain: Secondary | ICD-10-CM

## 2023-10-24 DIAGNOSIS — Z7985 Long-term (current) use of injectable non-insulin antidiabetic drugs: Secondary | ICD-10-CM | POA: Diagnosis not present

## 2023-10-24 DIAGNOSIS — E1169 Type 2 diabetes mellitus with other specified complication: Secondary | ICD-10-CM | POA: Diagnosis not present

## 2023-10-24 DIAGNOSIS — R3121 Asymptomatic microscopic hematuria: Secondary | ICD-10-CM

## 2023-10-24 DIAGNOSIS — I10 Essential (primary) hypertension: Secondary | ICD-10-CM | POA: Diagnosis not present

## 2023-10-24 DIAGNOSIS — E119 Type 2 diabetes mellitus without complications: Secondary | ICD-10-CM | POA: Insufficient documentation

## 2023-10-24 DIAGNOSIS — E039 Hypothyroidism, unspecified: Secondary | ICD-10-CM

## 2023-10-24 LAB — POCT URINALYSIS DIPSTICK
Glucose, UA: NEGATIVE
Ketones, UA: NEGATIVE
Nitrite, UA: NEGATIVE
Protein, UA: NEGATIVE
Spec Grav, UA: 1.02 (ref 1.010–1.025)
Urobilinogen, UA: 0.2 U/dL
pH, UA: 6 (ref 5.0–8.0)

## 2023-10-24 LAB — POCT GLYCOSYLATED HEMOGLOBIN (HGB A1C)
HbA1c POC (<> result, manual entry): 6 % (ref 4.0–5.6)
Hemoglobin A1C: 6 % — AB (ref 4.0–5.6)

## 2023-10-24 LAB — URINALYSIS, MICROSCOPIC ONLY

## 2023-10-24 LAB — MICROALBUMIN / CREATININE URINE RATIO
Creatinine,U: 157.9 mg/dL
Microalb Creat Ratio: 8.1 mg/g (ref 0.0–30.0)
Microalb, Ur: 1.3 mg/dL (ref 0.0–1.9)

## 2023-10-24 NOTE — Patient Instructions (Addendum)
 Thank you for choosing [facility] Sign medical release form to get your records from Dr. Arbie Knock (PAP smear report) and [facility] (lab results).  Go to [address] for x-ray. Monitor BP at home 2-3x/week in AM Bring BP machine and BP readings to next appt  Follow up as discussed.

## 2023-10-24 NOTE — Progress Notes (Unsigned)
 Established Patient Visit  Patient: [name]   DOB: 1981/02/19   42 y.o. Female  MRN: [id number] Visit Date: 10/26/2023  Subjective:    Chief Complaint  Patient presents with   Bladder issues     Feels full with discomfort for 5 months    Establish Care   HPI No previous pcp. Current care team: [facility]- management of hypothyroidism, Missy Amos, NP- [facility], and Dr. Joaquim Muir. [name] is her to establish care and to discuss Left side pain x 5months. Pain is intermittent and only occurs with need to urinate, pain resolves within a few minutes after urination. Pain does not radiate. No other symptoms present. Denies any back injury, no previous GI/GU or musculoskeletal of pelvic surgeries.  Monthly menstrual, LMP 10/16/2023, no dyspareunia, no dysmenorrhagia. No hematuria or dysuria Hx of constipation: resolved She had pelvic exam by GYN 2months ago. No abnormal finding per patient UA completed 08/09/2023-normal per patient.  HTN (hypertension), benign BP controlled with olmesartan , amlodipine  and hydrochlorothiazide Managed by [facility] BP Readings from Last 3 Encounters:  10/24/23 134/82  02/01/23 118/78  09/28/22 (!) 160/92     DM (diabetes mellitus) (HCC) repeat hgbA1c at 6% Controlled with ozempic 1mg  Managed by [facility] No neuropathy LDL at goal, no statin at this time Advised to schedule annual DIABETES eye exam Get UACr today: normal  Acquired hypothyroidism Dx prior to 2019; managed by integrative health  Reviewed medical, surgical, and social history today  Medications: Outpatient Medications Prior to Visit  Medication Sig   B Complex-Folic Acid (B COMPLEX-VITAMIN B12 PO) Take by mouth.   cyclobenzaprine (FLEXERIL) 10 MG tablet TAKE 1 TABLET BY MOUTH 1 TO 2 TIMES A DAY AS NEEDED   ibuprofen (ADVIL) 800 MG tablet Take 800 mg by mouth every 8  (eight) hours as needed.   Multiple Vitamin (MULTIVITAMIN) tablet Take 1 tablet by mouth daily.   NP THYROID 120 MG tablet Take 120 mg by mouth every morning.   Olmesartan -amLODIPine -HCTZ 40-10-12.5 MG TABS Take 1 tablet by mouth daily.   OZEMPIC, 1 MG/DOSE, 4 MG/3ML SOPN Inject 1 mg into the skin once a week.   Vitamin D, Ergocalciferol, (DRISDOL) 1.25 MG (50000 UNIT) CAPS capsule Take 50,000 Units by mouth once a week.   [DISCONTINUED] Cholecalciferol (VITAMIN D) 1000 UNITS capsule Take 1,000 Units by mouth daily.     [DISCONTINUED] RYBELSUS 3 MG TABS Take by mouth daily.   No facility-administered medications prior to visit.   Reviewed past medical and social history.   ROS per HPI above      Objective:  BP 134/82 (BP Location: Left Arm, Patient Position: Sitting, Cuff Size: Large)   Pulse 84   Temp 98.4 F (36.9 C) (Temporal)   Ht 6\' 1"  (1.854 m)   Wt (!) 460 lb 3.2 oz (208.7 kg)   LMP 10/16/2023   SpO2 97%   BMI 60.72 kg/m      Physical Exam Vitals and nursing note reviewed.  Cardiovascular:     Rate and Rhythm: Normal rate and regular rhythm.     Pulses: Normal pulses.     Heart sounds: Normal heart sounds.  Pulmonary:     Effort: Pulmonary effort is normal.     Breath sounds: Normal breath sounds.  Abdominal:     General: Bowel sounds are normal. There is no distension.  Palpations: Abdomen is soft. There is no mass.     Tenderness: There is no abdominal tenderness. There is no right CVA tenderness, left CVA tenderness or guarding.  Skin:    General: Skin is warm and dry.     Findings: No erythema or rash.  Neurological:     Mental Status: She is alert and oriented to person, place, and time.     Results for orders placed or performed in visit on 10/24/23  Microalbumin / creatinine urine ratio  Result Value Ref Range   Microalb, Ur 1.3 0.0 - 1.9 mg/dL   Creatinine,U 811.9 mg/dL   Microalb Creat Ratio 8.1 0.0 - 30.0 mg/g  Urine Microscopic Only   Result Value Ref Range   WBC, UA 3-6/hpf (A) 0-2/hpf   RBC / HPF 0-2/hpf 0-2/hpf   Mucus, UA Presence of (A) None   Squamous Epithelial / HPF Few(5-10/hpf) (A) Rare(0-4/hpf)   Bacteria, UA Few(10-50/hpf) (A) None   Amorphous Present (A) None;Present  POCT urinalysis dipstick  Result Value Ref Range   Color, UA     Clarity, UA     Glucose, UA Negative Negative   Bilirubin, UA 1+    Ketones, UA negative    Spec Grav, UA 1.020 1.010 - 1.025   Blood, UA 1+    pH, UA 6.0 5.0 - 8.0   Protein, UA Negative Negative   Urobilinogen, UA 0.2 0.2 or 1.0 E.U./dL   Nitrite, UA negative    Leukocytes, UA Moderate (2+) (A) Negative   Appearance     Odor    POCT glycosylated hemoglobin (Hb A1C)  Result Value Ref Range   Hemoglobin A1C 6.0 (A) 4.0 - 5.6 %   HbA1c POC (<> result, manual entry) 6.0 4.0 - 5.6 %   HbA1c, POC (prediabetic range)     HbA1c, POC (controlled diabetic range)        Assessment & Plan:    Problem List Items Addressed This Visit     Acquired hypothyroidism   Dx prior to 2019; managed by integrative health      DM (diabetes mellitus) (HCC)   repeat hgbA1c at 6% Controlled with ozempic 1mg  Managed by [facility] No neuropathy LDL at goal, no statin at this time Advised to schedule annual DIABETES eye exam Get UACr today: normal      Relevant Medications   OZEMPIC, 1 MG/DOSE, 4 MG/3ML SOPN   Other Relevant Orders   Microalbumin / creatinine urine ratio (Completed)   POCT glycosylated hemoglobin (Hb A1C) (Completed)   HTN (hypertension), benign   BP controlled with olmesartan , amlodipine  and hydrochlorothiazide Managed by [facility] BP Readings from Last 3 Encounters:  10/24/23 134/82  02/01/23 118/78  09/28/22 (!) 160/92         Other Visit Diagnoses       Colicky LLQ abdominal pain    -  Primary   Relevant Orders   POCT urinalysis dipstick (Completed)   DG Abd 1 View     Asymptomatic microscopic hematuria        Relevant Orders   Urine Microscopic Only (Completed)      Left side likely due to muscle spasm. No need for any meds at this time since pain resolve. Abnormal urine microscopic analysis. Encouraged to maintain adequate oral hydration. She is to return to lab for repeat UA with reflex culture.  Return in about 4 weeks (around 11/21/2023) for HTN, DM.     Kathrene Parents,  NP

## 2023-10-25 ENCOUNTER — Ambulatory Visit (INDEPENDENT_AMBULATORY_CARE_PROVIDER_SITE_OTHER)
Admission: RE | Admit: 2023-10-25 | Discharge: 2023-10-25 | Disposition: A | Discharge status: Home / Self Care | Source: Ambulatory Visit | Attending: Nurse Practitioner | Admitting: Nurse Practitioner

## 2023-10-25 DIAGNOSIS — R1032 Left lower quadrant pain: Secondary | ICD-10-CM

## 2023-10-26 ENCOUNTER — Encounter: Payer: Self-pay | Admitting: Nurse Practitioner

## 2023-10-26 NOTE — Assessment & Plan Note (Signed)
 BP controlled with olmesartan , amlodipine  and hydrochlorothiazide Managed by [facility] BP Readings from Last 3 Encounters:  10/24/23 134/82  02/01/23 118/78  09/28/22 (!) 160/92

## 2023-10-26 NOTE — Assessment & Plan Note (Addendum)
 repeat hgbA1c at 6% Controlled with ozempic 1mg  Managed by [facility] No neuropathy LDL at goal, no statin at this time Advised to schedule annual DIABETES eye exam Get UACr today: normal

## 2023-10-26 NOTE — Assessment & Plan Note (Signed)
 Dx prior to 2019; managed by integr[facility]

## 2023-11-05 ENCOUNTER — Other Ambulatory Visit (INDEPENDENT_AMBULATORY_CARE_PROVIDER_SITE_OTHER)

## 2023-11-05 DIAGNOSIS — R3121 Asymptomatic microscopic hematuria: Secondary | ICD-10-CM

## 2023-11-06 ENCOUNTER — Ambulatory Visit: Payer: Self-pay | Admitting: Nurse Practitioner

## 2023-11-06 LAB — URINALYSIS W MICROSCOPIC + REFLEX CULTURE
Bacteria, UA: NONE SEEN /HPF
Bilirubin Urine: NEGATIVE
Glucose, UA: NEGATIVE
Hgb urine dipstick: NEGATIVE
Hyaline Cast: NONE SEEN /LPF
Ketones, ur: NEGATIVE
Leukocyte Esterase: NEGATIVE
Nitrites, Initial: NEGATIVE
Protein, ur: NEGATIVE
RBC / HPF: NONE SEEN /HPF (ref 0–2)
Specific Gravity, Urine: 1.019 (ref 1.001–1.035)
WBC, UA: NONE SEEN /HPF (ref 0–5)
pH: 5.5 (ref 5.0–8.0)

## 2023-11-06 LAB — NO CULTURE INDICATED

## 2023-11-25 ENCOUNTER — Ambulatory Visit: Admitting: Nurse Practitioner

## 2024-01-17 ENCOUNTER — Encounter: Payer: Self-pay | Admitting: Nurse Practitioner

## 2024-01-17 ENCOUNTER — Ambulatory Visit: Admitting: Nurse Practitioner

## 2024-01-17 VITALS — BP 134/82 | HR 91 | Temp 98.3°F | Ht 73.0 in | Wt >= 6400 oz

## 2024-01-17 DIAGNOSIS — E282 Polycystic ovarian syndrome: Secondary | ICD-10-CM | POA: Diagnosis not present

## 2024-01-17 DIAGNOSIS — E1169 Type 2 diabetes mellitus with other specified complication: Secondary | ICD-10-CM

## 2024-01-17 DIAGNOSIS — Z7985 Long-term (current) use of injectable non-insulin antidiabetic drugs: Secondary | ICD-10-CM

## 2024-01-17 DIAGNOSIS — E039 Hypothyroidism, unspecified: Secondary | ICD-10-CM | POA: Diagnosis not present

## 2024-01-17 DIAGNOSIS — Z7984 Long term (current) use of oral hypoglycemic drugs: Secondary | ICD-10-CM

## 2024-01-17 DIAGNOSIS — I10 Essential (primary) hypertension: Secondary | ICD-10-CM

## 2024-01-17 LAB — T4, FREE: Free T4: 0.78 ng/dL (ref 0.60–1.60)

## 2024-01-17 LAB — COMPREHENSIVE METABOLIC PANEL WITH GFR
ALT: 14 U/L (ref 0–35)
AST: 17 U/L (ref 0–37)
Albumin: 4 g/dL (ref 3.5–5.2)
Alkaline Phosphatase: 52 U/L (ref 39–117)
BUN: 12 mg/dL (ref 6–23)
CO2: 27 meq/L (ref 19–32)
Calcium: 9.4 mg/dL (ref 8.4–10.5)
Chloride: 102 meq/L (ref 96–112)
Creatinine, Ser: 1 mg/dL (ref 0.40–1.20)
GFR: 70.16 mL/min
Glucose, Bld: 131 mg/dL — ABNORMAL HIGH (ref 70–99)
Potassium: 3.8 meq/L (ref 3.5–5.1)
Sodium: 137 meq/L (ref 135–145)
Total Bilirubin: 0.7 mg/dL (ref 0.2–1.2)
Total Protein: 7.8 g/dL (ref 6.0–8.3)

## 2024-01-17 LAB — LDL CHOLESTEROL, DIRECT: Direct LDL: 89 mg/dL

## 2024-01-17 LAB — T3, FREE: T3, Free: 4.6 pg/mL — ABNORMAL HIGH (ref 2.3–4.2)

## 2024-01-17 LAB — TSH: TSH: 0.86 u[IU]/mL (ref 0.35–5.50)

## 2024-01-17 MED ORDER — METFORMIN HCL 500 MG PO TABS: 250.0000 mg | ORAL_TABLET | Freq: Every day | ORAL | 5 refills | Status: DC

## 2024-01-17 NOTE — Assessment & Plan Note (Signed)
 repeat hgbA1c at 6% Controlled with ozempic 1mg  Managed by [facility] No neuropathy, normal foot exam Normal UACr BP at goal UTD with eye exam-report requested  With hx of PCOS: we discussed adding metformin 250mg  daily vs increasing ozempic dose to 2.4mg  weekly? She opted to maintain ozempic dose at 1mg  and adding metformin 250mg  daily. Repeat CMP and LDL today F/up in 3months

## 2024-01-17 NOTE — Progress Notes (Signed)
 Established Patient Visit  Patient: [name]   DOB: 03/25/1981   43 y.o. Female  MRN: [id number] Visit Date: 01/17/2024  Subjective:    Chief Complaint  Patient presents with   Follow-up    Follow up for HTN    HPI Acquired hypothyroidism Dx prior to 2019; managed by integrative health Current use of NP thyroid 120mg  (equivalent to 200mcg of levothyroxine) No previous thyroid US  No Fhx of thyroid disorder.  She opted to switch to levothyroxine. Check THYROID, T3, T4, anti-TPO today. Will send levothyroxine 200mcg and repeat labs in 4-6weeks)  DM (diabetes mellitus) (HCC) repeat hgbA1c at 6% Controlled with ozempic 1mg  Managed by [facility] No neuropathy, normal foot exam Normal UACr BP at goal UTD with eye exam-report requested  With hx of PCOS: we discussed adding metformin 250mg  daily vs increasing ozempic dose to 2.4mg  weekly? She opted to maintain ozempic dose at 1mg  and adding metformin 250mg  daily. Repeat CMP and LDL today F/up in 3months  Reviewed medical, surgical, and social history today  Medications: Outpatient Medications Prior to Visit  Medication Sig   B Complex-Folic Acid (B COMPLEX-VITAMIN B12 PO) Take by mouth.   cyclobenzaprine (FLEXERIL) 10 MG tablet TAKE 1 TABLET BY MOUTH 1 TO 2 TIMES A DAY AS NEEDED   ibuprofen (ADVIL) 800 MG tablet Take 800 mg by mouth every 8 (eight) hours as needed.   Multiple Vitamin (MULTIVITAMIN) tablet Take 1 tablet by mouth daily.   NP THYROID 120 MG tablet Take 120 mg by mouth every morning.   Olmesartan -amLODIPine -HCTZ 40-10-12.5 MG TABS Take 1 tablet by mouth daily.   OZEMPIC, 1 MG/DOSE, 4 MG/3ML SOPN Inject 1 mg into the skin once a week.   Vitamin D, Ergocalciferol, (DRISDOL) 1.25 MG (50000 UNIT) CAPS capsule Take 50,000 Units by mouth once a week.   No facility-administered medications prior to visit.   Reviewed past medical and social history.   ROS per HPI above       Objective:  BP 134/82 (BP Location: Left Arm, Patient Position: Sitting, Cuff Size: Large)   Pulse 91   Temp 98.3 F (36.8 C) (Oral)   Ht 6' 1 (1.854 m)   Wt (!) 455 lb 6.4 oz (206.6 kg)   LMP 12/21/2023   SpO2 99%   BMI 60.08 kg/m      Physical Exam Vitals and nursing note reviewed.  Cardiovascular:     Rate and Rhythm: Normal rate and regular rhythm.     Pulses: Normal pulses.     Heart sounds: Normal heart sounds.  Pulmonary:     Effort: Pulmonary effort is normal.     Breath sounds: Normal breath sounds.  Neurological:     Mental Status: She is alert and oriented to person, place, and time.     No results found for any visits on 01/17/24.    Assessment & Plan:    Problem List Items Addressed This Visit     Acquired hypothyroidism   Dx prior to 2019; managed by integrative health Current use of NP thyroid 120mg  (equivalent to 200mcg of levothyroxine) No previous thyroid US  No Fhx of thyroid disorder.  She opted to switch to levothyroxine. Check THYROID, T3, T4, anti-TPO today. Will send levothyroxine 200mcg and repeat labs in 4-6weeks)      Relevant Orders   T4, free   TSH   T3, free   Thyroid peroxidase  antibody   DM (diabetes mellitus) (HCC)   repeat hgbA1c at 6% Controlled with ozempic 1mg  Managed by [facility] No neuropathy, normal foot exam Normal UACr BP at goal UTD with eye exam-report requested  With hx of PCOS: we discussed adding metformin 250mg  daily vs increasing ozempic dose to 2.4mg  weekly? She opted to maintain ozempic dose at 1mg  and adding metformin 250mg  daily. Repeat CMP and LDL today F/up in 3months      Relevant Medications   metFORMIN (GLUCOPHAGE) 500 MG tablet   Other Relevant Orders   Comprehensive metabolic panel with GFR   Direct LDL   HTN (hypertension), benign - Primary   Relevant Medications   metFORMIN (GLUCOPHAGE) 500 MG tablet   Other Relevant Orders   Comprehensive metabolic panel with  GFR   PCOS (polycystic ovarian syndrome)   Relevant Medications   metFORMIN (GLUCOPHAGE) 500 MG tablet   Return in about 3 months (around 04/18/2024) for DM, Hypothyroidism.     Roselie Mood, NP

## 2024-01-17 NOTE — Patient Instructions (Signed)
 Sign medical release to get records with [facility], [facility] and Dr. Arzella Go to lab

## 2024-01-17 NOTE — Assessment & Plan Note (Addendum)
 Dx prior to 2019; managed by integrative health Current use of NP thyroid 120mg  (equivalent to 200mcg of levothyroxine) No previous thyroid US  No Fhx of thyroid disorder.  She opted to switch to levothyroxine. Check THYROID, T3, T4, anti-TPO today. Will send levothyroxine 200mcg and repeat labs in 4-6weeks)

## 2024-01-19 ENCOUNTER — Encounter: Payer: Self-pay | Admitting: Nurse Practitioner

## 2024-01-19 DIAGNOSIS — E282 Polycystic ovarian syndrome: Secondary | ICD-10-CM

## 2024-01-19 DIAGNOSIS — E1169 Type 2 diabetes mellitus with other specified complication: Secondary | ICD-10-CM

## 2024-01-19 DIAGNOSIS — I10 Essential (primary) hypertension: Secondary | ICD-10-CM

## 2024-01-20 LAB — THYROID PEROXIDASE ANTIBODY: Thyroperoxidase Ab SerPl-aCnc: 1 [IU]/mL (ref <9)

## 2024-01-22 ENCOUNTER — Encounter: Payer: Self-pay | Admitting: Nurse Practitioner

## 2024-01-22 MED ORDER — OZEMPIC (2 MG/DOSE) 8 MG/3ML ~~LOC~~ SOPN: 2.0000 mg | PEN_INJECTOR | SUBCUTANEOUS | 1 refills | Status: DC

## 2024-01-22 NOTE — Addendum Note (Signed)
 Addended by: KATHEEN GARDEN L on: 01/22/2024 04:20 PM   Modules accepted: Orders

## 2024-01-22 NOTE — Assessment & Plan Note (Signed)
 She opted to increase ozempic  dose to 2mg  and d/c metformin  New rx sent

## 2024-01-23 ENCOUNTER — Ambulatory Visit: Payer: Self-pay | Admitting: Nurse Practitioner

## 2024-01-23 DIAGNOSIS — E039 Hypothyroidism, unspecified: Secondary | ICD-10-CM

## 2024-01-23 MED ORDER — LEVOTHYROXINE SODIUM 100 MCG PO TABS: 100.0000 ug | ORAL_TABLET | Freq: Every day | ORAL | 5 refills | Status: DC

## 2024-01-23 NOTE — Assessment & Plan Note (Signed)
 Tsh and T4 are normal Negative THYROID  antibody Elevated T3 Stop NP thyroid  Start levothyroxine  100mcg. Schedule lab appointment to repeat THYROID  function in 4weeks

## 2024-02-06 ENCOUNTER — Other Ambulatory Visit (HOSPITAL_COMMUNITY): Payer: Self-pay

## 2024-02-07 ENCOUNTER — Other Ambulatory Visit (HOSPITAL_COMMUNITY): Payer: Self-pay

## 2024-02-13 NOTE — Telephone Encounter (Signed)
 Error

## 2024-02-16 ENCOUNTER — Other Ambulatory Visit: Payer: Self-pay | Admitting: Cardiology

## 2024-02-24 ENCOUNTER — Encounter: Payer: Self-pay | Admitting: Nurse Practitioner

## 2024-02-26 ENCOUNTER — Other Ambulatory Visit: Payer: Self-pay

## 2024-02-26 MED ORDER — OLMESARTAN-AMLODIPINE-HCTZ 40-10-12.5 MG PO TABS: 1.0000 | ORAL_TABLET | Freq: Every day | ORAL | 0 refills | Status: DC

## 2024-02-27 ENCOUNTER — Other Ambulatory Visit (INDEPENDENT_AMBULATORY_CARE_PROVIDER_SITE_OTHER)

## 2024-02-27 DIAGNOSIS — E039 Hypothyroidism, unspecified: Secondary | ICD-10-CM | POA: Diagnosis not present

## 2024-02-28 ENCOUNTER — Ambulatory Visit: Payer: Self-pay | Admitting: Nurse Practitioner

## 2024-02-28 LAB — THYROID PANEL WITH TSH
Free Thyroxine Index: 2.5 (ref 1.4–3.8)
T3 Uptake: 29 % (ref 22–35)
T4, Total: 8.5 ug/dL (ref 5.1–11.9)
TSH: 1.76 m[IU]/L

## 2024-03-18 ENCOUNTER — Telehealth: Payer: Self-pay | Admitting: Cardiology

## 2024-03-18 NOTE — Telephone Encounter (Signed)
*  STAT* If patient is at the pharmacy, call can be transferred to refill team.   1. Which medications need to be refilled? (please list name of each medication and dose if known)  Olmesartan -amLODIPine -HCTZ 40-10-12.5 MG TABS    2. Which pharmacy/location (including street and city if local pharmacy) is medication to be sent to? [id number] - [place], [place] - [address] AT [facility] [address]    3. Do they need a 30 day or 90 day supply?  90 day supply

## 2024-03-19 MED ORDER — OLMESARTAN-AMLODIPINE-HCTZ 40-10-12.5 MG PO TABS: 1.0000 | ORAL_TABLET | Freq: Every day | ORAL | 0 refills | Status: DC

## 2024-03-19 NOTE — Telephone Encounter (Signed)
 RX sent in

## 2024-03-20 ENCOUNTER — Other Ambulatory Visit: Payer: Self-pay | Admitting: Cardiology

## 2024-04-10 ENCOUNTER — Encounter: Payer: Self-pay | Admitting: Nurse Practitioner

## 2024-04-17 ENCOUNTER — Ambulatory Visit: Admitting: Nurse Practitioner

## 2024-04-17 ENCOUNTER — Encounter: Payer: Self-pay | Admitting: Nurse Practitioner

## 2024-04-17 VITALS — BP 132/80 | HR 88 | Ht 73.0 in | Wt >= 6400 oz

## 2024-04-17 DIAGNOSIS — E1169 Type 2 diabetes mellitus with other specified complication: Secondary | ICD-10-CM

## 2024-04-17 DIAGNOSIS — E282 Polycystic ovarian syndrome: Secondary | ICD-10-CM

## 2024-04-17 DIAGNOSIS — I10 Essential (primary) hypertension: Secondary | ICD-10-CM

## 2024-04-17 LAB — COMPREHENSIVE METABOLIC PANEL WITH GFR
ALT: 12 U/L (ref 0–35)
AST: 12 U/L (ref 0–37)
Albumin: 4 g/dL (ref 3.5–5.2)
Alkaline Phosphatase: 62 U/L (ref 39–117)
BUN: 13 mg/dL (ref 6–23)
CO2: 25 meq/L (ref 19–32)
Calcium: 9.1 mg/dL (ref 8.4–10.5)
Chloride: 100 meq/L (ref 96–112)
Creatinine, Ser: 0.92 mg/dL (ref 0.40–1.20)
GFR: 77.4 mL/min (ref >60.00)
Glucose, Bld: 89 mg/dL (ref 70–99)
Potassium: 3.7 meq/L (ref 3.5–5.1)
Sodium: 135 meq/L (ref 135–145)
Total Bilirubin: 0.7 mg/dL (ref 0.2–1.2)
Total Protein: 8.1 g/dL (ref 6.0–8.3)

## 2024-04-17 LAB — HEMOGLOBIN A1C: Hgb A1c MFr Bld: 6.6 % — ABNORMAL HIGH (ref 4.6–6.5)

## 2024-04-17 LAB — LIPID PANEL
Cholesterol: 178 mg/dL (ref 0–200)
HDL: 50.8 mg/dL (ref >39.00)
LDL Cholesterol: 112 mg/dL — ABNORMAL HIGH (ref 0–99)
NonHDL: 127.46
Total CHOL/HDL Ratio: 4
Triglycerides: 76 mg/dL (ref 0.0–149.0)
VLDL: 15.2 mg/dL (ref 0.0–40.0)

## 2024-04-17 MED ORDER — TIRZEPATIDE 2.5 MG/0.5ML ~~LOC~~ SOAJ: 2.5000 mg | SUBCUTANEOUS | 2 refills | Status: DC

## 2024-04-17 MED ORDER — OLMESARTAN-AMLODIPINE-HCTZ 40-10-12.5 MG PO TABS: 1.0000 | ORAL_TABLET | Freq: Every day | ORAL | 0 refills | Status: DC

## 2024-04-17 NOTE — Assessment & Plan Note (Signed)
 BP controlled with olmesartan , amlodipine  and hydrochlorothiazide Managed by cardiology BP Readings from Last 3 Encounters:  04/17/24 132/80  01/17/24 134/82  10/24/23 134/82    Repeat CMP Maintain med dose and f/up appointment with cardiology

## 2024-04-17 NOTE — Assessment & Plan Note (Signed)
 Unable to tolerate metformin  and ozempic  2mg - severe abd pain, diarrhea, and belching. GI symptoms resolved with med discontinuation. Normal UACr BP at goal LDL at 89, no statin at this time. No neuropathy.  She agreed to try mounjaro 2.5mg  weekly Repeat hgbA1c, lipid panel and CMP F/up in 3months

## 2024-04-17 NOTE — Progress Notes (Signed)
 Established Patient Visit  Patient: [name]   DOB: 06/18/1981   43 y.o. Female  MRN: [id number] Visit Date: 04/17/2024  Subjective:    Chief Complaint  Patient presents with   Follow-up    3 month follow for DM    HPI HTN (hypertension), benign BP controlled with olmesartan , amlodipine  and hydrochlorothiazide Managed by cardiology BP Readings from Last 3 Encounters:  04/17/24 132/80  01/17/24 134/82  10/24/23 134/82    Repeat CMP Maintain med dose and f/up appointment with cardiology  DM (diabetes mellitus) (HCC) Unable to tolerate metformin  and ozempic  2mg - severe abd pain, diarrhea, and belching. GI symptoms resolved with med discontinuation. Normal UACr BP at goal LDL at 89, no statin at this time. No neuropathy.  She agreed to try mounjaro 2.5mg  weekly Repeat hgbA1c, lipid panel and CMP F/up in 3months  Wt Readings from Last 3 Encounters:  04/17/24 (!) 456 lb 12.8 oz (207.2 kg)  01/17/24 (!) 455 lb 6.4 oz (206.6 kg)  10/24/23 (!) 460 lb 3.2 oz (208.7 kg)    Reviewed medical, surgical, and social history today  Medications: Outpatient Medications Prior to Visit  Medication Sig   B Complex-Folic Acid (B COMPLEX-VITAMIN B12 PO) Take by mouth.   cyclobenzaprine (FLEXERIL) 10 MG tablet TAKE 1 TABLET BY MOUTH 1 TO 2 TIMES A DAY AS NEEDED   ibuprofen (ADVIL) 800 MG tablet Take 800 mg by mouth every 8 (eight) hours as needed.   levothyroxine  (SYNTHROID ) 100 MCG tablet Take 1 tablet (100 mcg total) by mouth daily.   Multiple Vitamin (MULTIVITAMIN) tablet Take 1 tablet by mouth daily.   Vitamin D, Ergocalciferol, (DRISDOL) 1.25 MG (50000 UNIT) CAPS capsule Take 50,000 Units by mouth once a week.   [DISCONTINUED] Olmesartan -amLODIPine -HCTZ 40-10-12.5 MG TABS Take 1 tablet by mouth daily. Take 1 tablet by mouth daily.   [DISCONTINUED] Semaglutide , 2 MG/DOSE, (OZEMPIC , 2 MG/DOSE,) 8 MG/3ML SOPN Inject 2 mg into the skin once a week. (Patient not  taking: Reported on 04/17/2024)   No facility-administered medications prior to visit.   Reviewed past medical and social history.   ROS per HPI above      Objective:  BP 132/80 (BP Location: Left Arm, Patient Position: Sitting, Cuff Size: Large)   Pulse 88   Ht 6' 1 (1.854 m)   Wt (!) 456 lb 12.8 oz (207.2 kg)   LMP 03/24/2024   SpO2 98%   BMI 60.27 kg/m      Physical Exam Vitals and nursing note reviewed.  Constitutional:      Appearance: She is obese.  Cardiovascular:     Rate and Rhythm: Normal rate and regular rhythm.     Pulses: Normal pulses.     Heart sounds: Normal heart sounds.  Pulmonary:     Effort: Pulmonary effort is normal.     Breath sounds: Normal breath sounds.  Musculoskeletal:     Right lower leg: No edema.     Left lower leg: No edema.  Neurological:     Mental Status: She is alert and oriented to person, place, and time.     No results found for any visits on 04/17/24.    Assessment & Plan:    Problem List Items Addressed This Visit     DM (diabetes mellitus) (HCC)   Unable to tolerate metformin  and ozempic  2mg - severe abd pain, diarrhea, and belching. GI symptoms resolved with med discontinuation.  Normal UACr BP at goal LDL at 89, no statin at this time. No neuropathy.  She agreed to try mounjaro 2.5mg  weekly Repeat hgbA1c, lipid panel and CMP F/up in 3months      Relevant Medications   tirzepatide (MOUNJARO) 2.5 MG/0.5ML Pen   Olmesartan -amLODIPine -HCTZ 40-10-12.5 MG TABS   Other Relevant Orders   Comprehensive metabolic panel with GFR   Lipid panel   Hemoglobin A1c   HTN (hypertension), benign - Primary   BP controlled with olmesartan , amlodipine  and hydrochlorothiazide Managed by cardiology BP Readings from Last 3 Encounters:  04/17/24 132/80  01/17/24 134/82  10/24/23 134/82    Repeat CMP Maintain med dose and f/up appointment with cardiology      Relevant Medications   tirzepatide (MOUNJARO) 2.5 MG/0.5ML Pen    Olmesartan -amLODIPine -HCTZ 40-10-12.5 MG TABS   Other Relevant Orders   Comprehensive metabolic panel with GFR   Lipid panel   Morbid obesity (HCC)   Relevant Medications   tirzepatide (MOUNJARO) 2.5 MG/0.5ML Pen   PCOS (polycystic ovarian syndrome)   Relevant Medications   tirzepatide (MOUNJARO) 2.5 MG/0.5ML Pen   Return in about 3 months (around 07/18/2024) for HTN, DM, hyperlipidemia (fasting).     Roselie Mood, NP

## 2024-04-17 NOTE — Patient Instructions (Signed)
 Go to lab Maintain Heart healthy diet and daily exercise. Maintain current medications.

## 2024-04-19 ENCOUNTER — Ambulatory Visit: Payer: Self-pay | Admitting: Nurse Practitioner

## 2024-04-23 ENCOUNTER — Telehealth: Payer: Self-pay

## 2024-04-23 ENCOUNTER — Other Ambulatory Visit (HOSPITAL_COMMUNITY): Payer: Self-pay

## 2024-04-23 NOTE — Telephone Encounter (Signed)
 Pharmacy Patient Advocate Encounter   Received notification from [facility] that Prior Authorization for Mounjaro 2.5MG /0.5ML auto-injectors  has been APPROVED from 03/24/2024 to 04/23/2025. Ran test claim, Copay is $35. This test claim was processed through [facility]- copay amounts may vary at other pharmacies due to pharmacy/plan contracts, or as the patient moves through the different stages of their insurance plan.    PA #/Case ID/Reference #: [id number]

## 2024-04-23 NOTE — Telephone Encounter (Signed)
 PA needed for Tirzepatide (Mounjaro) 2.5 mg/0.5 mL

## 2024-04-23 NOTE — Telephone Encounter (Signed)
 Tried to call patient to inform of PA approval could not leave a voice message will send a MyChart message

## 2024-04-23 NOTE — Telephone Encounter (Signed)
 Pharmacy Patient Advocate Encounter   Received notification from Physician's Office that prior authorization for Mounjaro 2.5MG /0.5ML auto-injectors is required/requested.   Insurance verification completed.   The patient is insured through [facility].   Per test claim: PA required; PA submitted to above mentioned insurance via Latent Key/confirmation #/EOC [id number] Status is pending

## 2024-04-23 NOTE — Telephone Encounter (Signed)
 This encounter was created in error - please disregard.

## 2024-04-23 NOTE — Telephone Encounter (Signed)
 PA request has been Submitted. New Encounter has been or will be created for follow up. For additional info see Pharmacy Prior Auth telephone encounter from 04/23/2024.

## 2024-04-24 ENCOUNTER — Other Ambulatory Visit (HOSPITAL_COMMUNITY): Payer: Self-pay

## 2024-04-29 ENCOUNTER — Other Ambulatory Visit (HOSPITAL_COMMUNITY): Payer: Self-pay

## 2024-06-01 ENCOUNTER — Encounter: Payer: Self-pay | Admitting: Cardiology

## 2024-06-01 DIAGNOSIS — I10 Essential (primary) hypertension: Secondary | ICD-10-CM

## 2024-06-02 ENCOUNTER — Other Ambulatory Visit: Payer: Self-pay | Admitting: Cardiology

## 2024-06-02 DIAGNOSIS — I10 Essential (primary) hypertension: Secondary | ICD-10-CM

## 2024-06-02 MED ORDER — OLMESARTAN-AMLODIPINE-HCTZ 40-10-12.5 MG PO TABS: 1.0000 | ORAL_TABLET | Freq: Every day | ORAL | 0 refills | Status: DC

## 2024-06-16 ENCOUNTER — Encounter: Payer: Self-pay | Admitting: Cardiology

## 2024-06-16 ENCOUNTER — Ambulatory Visit: Attending: Cardiology | Admitting: Cardiology

## 2024-06-16 VITALS — BP 148/86 | HR 90 | Ht 73.0 in | Wt >= 6400 oz

## 2024-06-16 DIAGNOSIS — I10 Essential (primary) hypertension: Secondary | ICD-10-CM

## 2024-06-16 DIAGNOSIS — I517 Cardiomegaly: Secondary | ICD-10-CM

## 2024-06-16 DIAGNOSIS — I119 Hypertensive heart disease without heart failure: Secondary | ICD-10-CM

## 2024-06-16 LAB — HM MAMMOGRAPHY

## 2024-06-16 NOTE — Progress Notes (Signed)
 " Cardiology Office Note:    Date:  06/16/2024   ID:  [name], DOB [birth year], MRN [id number]  PCP:  Katheen Roselie Rockford, NP  Cardiologist:  Matalie Romberger, DO  Electrophysiologist:  None   Referring MD: Katheen Roselie Rockford, NP    I am ok  History of Present Illness:    [name] is a 43 y.o. female with a hx of morbid obesity, hypertension which was diagnosed several years back, PCOS here today for a follow up visit.  She shares with me today that her brother was just diagnosed with lymphoma and she has been helping with his care.   Past Medical History:  Diagnosis Date   Diabetes mellitus without complication (HCC)    Hypertension    Morbid obesity (HCC)    Pilonidal cyst with abscess     Past Surgical History:  Procedure Laterality Date   PILONIDAL CYST DRAINAGE     PILONIDAL CYST EXCISION      Current Medications: Current Meds  Medication Sig   B Complex-Folic Acid (B COMPLEX-VITAMIN B12 PO) Take by mouth.   cyclobenzaprine (FLEXERIL) 10 MG tablet TAKE 1 TABLET BY MOUTH 1 TO 2 TIMES A DAY AS NEEDED   ibuprofen (ADVIL) 800 MG tablet Take 800 mg by mouth every 8 (eight) hours as needed.   levothyroxine  (SYNTHROID ) 100 MCG tablet Take 1 tablet (100 mcg total) by mouth daily.   Multiple Vitamin (MULTIVITAMIN) tablet Take 1 tablet by mouth daily.   Olmesartan -amLODIPine -HCTZ 40-10-12.5 MG TABS Take 1 tablet by mouth daily. Take 1 tablet by mouth daily.   tirzepatide  (MOUNJARO ) 2.5 MG/0.5ML Pen Inject 2.5 mg into the skin once a week.   Vitamin D, Ergocalciferol, (DRISDOL) 1.25 MG (50000 UNIT) CAPS capsule Take 50,000 Units by mouth once a week.     Allergies:   Patient has no known allergies.   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  Lives with boyfriend   Social Drivers of Health   Tobacco Use: Low Risk (06/16/2024)   Patient History    Smoking Tobacco Use: Never    Smokeless Tobacco Use: Never    Passive Exposure: Not on file  Financial Resource Strain: Low Risk (10/23/2023)   Overall Financial Resource Strain (CARDIA)    Difficulty of Paying Living Expenses: Not hard at all  Food Insecurity: No Food Insecurity (10/23/2023)   Hunger Vital Sign    Worried About Running Out of Food in the Last Year: Never true    Ran Out of Food in the Last Year: Never true  Transportation Needs: No Transportation Needs (10/23/2023)   PRAPARE - Administrator, Civil Service (Medical): No    Lack of Transportation (Non-Medical): No  Physical Activity:  Insufficiently Active (10/23/2023)   Exercise Vital Sign    Days of Exercise per Week: 4 days    Minutes of Exercise per Session: 30 min  Stress: No Stress Concern Present (10/23/2023)   [employer] of Occupational Health - Occupational Stress Questionnaire    Feeling of Stress : Only a little  Social Connections: Socially Isolated (10/23/2023)   Social Connection and Isolation Panel    Frequency of Communication with Friends and Family: More than three times a week    Frequency of Social Gatherings with Friends and Family: More than three times a week    Attends Religious Services: Never    Database Administrator or Organizations: No    Attends Engineer, Structural: Not on file    Marital Status: Never married  Depression (PHQ2-9): Low  Risk (10/24/2023)   Depression (PHQ2-9)    PHQ-2 Score: 1  Alcohol Screen: Low Risk (10/23/2023)   Alcohol Screen    Last Alcohol Screening Score (AUDIT): 1  Housing: Low Risk (10/23/2023)   Housing Stability Vital Sign    Unable to Pay for Housing in the Last Year: No    Number of Times Moved in the Last Year: 0    Homeless in the Last Year: No  Utilities: Not At Risk (10/24/2023)   AHC Utilities    Threatened with loss of utilities: No  Health Literacy: Adequate Health Literacy (10/24/2023)   B1300 Health Literacy    Frequency of need for help with medical instructions: Never     Family History: The patient's family history includes Alcohol abuse in her father; COPD in her mother; Diabetes in her maternal grandmother and paternal grandmother; Drug abuse in her father; Healthy in her brother, sister, and sister; Heart disease in her maternal grandmother; Hyperlipidemia in her father; Hypertension in her father, maternal grandmother, mother, and paternal grandmother; Kidney disease in her maternal grandmother. There is no history of Cancer.  ROS:   Review of Systems  Constitution: Negative for decreased appetite, fever and weight gain.  HENT: Negative for congestion, ear discharge, hoarse voice and sore throat.   Eyes: Negative for discharge, redness, vision loss in right eye and visual halos.  Cardiovascular: Negative for chest pain, dyspnea on exertion, leg swelling, orthopnea and palpitations.  Respiratory: Negative for cough, hemoptysis, shortness of breath and snoring.   Endocrine: Negative for heat intolerance and polyphagia.  Hematologic/Lymphatic: Negative for bleeding problem. Does not bruise/bleed easily.  Skin: Negative for flushing, nail changes, rash and suspicious lesions.  Musculoskeletal: Negative for arthritis, joint pain, muscle cramps, myalgias, neck pain and stiffness.  Gastrointestinal: Negative for abdominal pain, bowel incontinence, diarrhea and excessive appetite.   Genitourinary: Negative for decreased libido, genital sores and incomplete emptying.  Neurological: Negative for brief paralysis, focal weakness, headaches and loss of balance.  Psychiatric/Behavioral: Negative for altered mental status, depression and suicidal ideas.  Allergic/Immunologic: Negative for HIV exposure and persistent infections.    EKGs/Labs/Other Studies Reviewed:    The following studies were reviewed today:   EKG:  The ekg ordered today demonstrates sinus rhythm, heart rate 92 bpm with P wave morphology suggesting left atrial enlargement.  Tte 07/27/2022 IMPRESSIONS    1. Left ventricular ejection fraction, by estimation, is 60 to 65%. The  left ventricle has normal function. The left ventricle has no regional  wall motion abnormalities. There is mild left ventricular hypertrophy.  Left ventricular diastolic parameters  were normal.   2. Right ventricular systolic function is normal. The  right ventricular  size is normal. Tricuspid regurgitation signal is inadequate for assessing  PA pressure.   3. The mitral valve is normal in structure. No evidence of mitral valve  regurgitation. No evidence of mitral stenosis.   4. The aortic valve was not well visualized. Aortic valve regurgitation  is not visualized. No aortic stenosis is present.   5. The inferior vena cava is normal in size with greater than 50%  respiratory variability, suggesting right atrial pressure of 3 mmHg.   FINDINGS   Left Ventricle: Left ventricular ejection fraction, by estimation, is 60  to 65%. The left ventricle has normal function. The left ventricle has no  regional wall motion abnormalities. Definity  contrast agent was given IV  to delineate the left ventricular   endocardial borders. The left ventricular internal cavity size was normal  in size. There is mild left ventricular hypertrophy. Left ventricular  diastolic parameters were normal.   Right Ventricle: The right ventricular size  is normal. No increase in  right ventricular wall thickness. Right ventricular systolic function is  normal. Tricuspid regurgitation signal is inadequate for assessing PA  pressure.   Left Atrium: Left atrial size was normal in size.   Right Atrium: Right atrial size was normal in size.   Pericardium: There is no evidence of pericardial effusion. Presence of  epicardial fat layer.   Mitral Valve: The mitral valve is normal in structure. No evidence of  mitral valve regurgitation. No evidence of mitral valve stenosis.   Tricuspid Valve: The tricuspid valve is normal in structure. Tricuspid  valve regurgitation is not demonstrated. No evidence of tricuspid  stenosis.   Aortic Valve: The aortic valve was not well visualized. Aortic valve  regurgitation is not visualized. No aortic stenosis is present.   Pulmonic Valve: The pulmonic valve was normal in structure. Pulmonic valve  regurgitation is trivial. No evidence of pulmonic stenosis.   Aorta: The aortic root is normal in size and structure.   Venous: The inferior vena cava is normal in size with greater than 50%  respiratory variability, suggesting right atrial pressure of 3 mmHg.   IAS/Shunts: No atrial level shunt detected by color flow Doppler.    Recent Labs: 02/27/2024: TSH 1.76 04/17/2024: ALT 12; BUN 13; Creatinine, Ser 0.92; Potassium 3.7; Sodium 135  Recent Lipid Panel    Component Value Date/Time   CHOL 178 04/17/2024 1347   TRIG 76.0 04/17/2024 1347   HDL 50.80 04/17/2024 1347   CHOLHDL 4 04/17/2024 1347   VLDL 15.2 04/17/2024 1347   LDLCALC 112 (H) 04/17/2024 1347   LDLDIRECT 89.0 01/17/2024 1346    Physical Exam:    VS:  BP (!) 148/86   Pulse 90   Ht 6' 1 (1.854 m)   Wt (!) 461 lb (209.1 kg)   SpO2 97%   BMI 60.82 kg/m     Wt Readings from Last 3 Encounters:  06/16/24 (!) 461 lb (209.1 kg)  04/17/24 (!) 456 lb 12.8 oz (207.2 kg)  01/17/24 (!) 455 lb 6.4 oz (206.6 kg)     GEN: Well nourished,  well developed in no acute distress HEENT: Normal NECK: No JVD; No carotid bruits LYMPHATICS: No lymphadenopathy CARDIAC: S1S2 noted,RRR, no murmurs, rubs, gallops RESPIRATORY:  Clear to auscultation without rales, wheezing or rhonchi  ABDOMEN: Soft, non-tender, non-distended, +bowel sounds, no guarding. EXTREMITIES: No edema, No cyanosis, no clubbing MUSCULOSKELETAL:  No deformity  SKIN: Warm and dry NEUROLOGIC:  Alert and oriented x 3, non-focal PSYCHIATRIC:  Normal affect, good insight  ASSESSMENT:    1. HTN (hypertension), benign   2. Hypertensive heart disease without heart failure   3. LVH (left ventricular hypertrophy)     PLAN:    She is hypertensive in the office today follow-up blood pressure taken by me as well on a combinations pill ( Olmesartan  40mg  - Amlodipine  10 mg - hydrochlorothiazide 12.5 mg) she prefers not to have increasing antihypertensive medication at this point her biggest goal is to be taken off of it.  But I did share with the patient this is not possible at this time.  She will take her blood pressure daily and send me updated blood pressure in 2 weeks.  Should this be elevated I plan to adjust antihypertensives.  Will continue to exercise.  The patient is in agreement with the above plan. The patient left the office in stable condition.  The patient will follow up in 4 months or sooner if needed.   Medication Adjustments/Labs and Tests Ordered: Current medicines are reviewed at length with the patient today.  Concerns regarding medicines are outlined above.  Orders Placed This Encounter  Procedures   EKG 12-Lead   No orders of the defined types were placed in this encounter.   Patient Instructions  Medication Instructions:  Your physician recommends that you continue on your current medications as directed. Please refer to the Current Medication list given to you today.  *If you need a refill on your cardiac medications before your next  appointment, please call your pharmacy*   Follow-Up: At [facility], you and your health needs are our priority.  As part of our continuing mission to provide you with exceptional heart care, our providers are all part of one team.  This team includes your primary Cardiologist (physician) and Advanced Practice Providers or APPs (Physician Assistants and Nurse Practitioners) who all work together to provide you with the care you need, when you need it.  Your next appointment:   4 month(s)  Provider:   Steffen Hase, DO      Adopting a Healthy Lifestyle.  Know what a healthy weight is for you (roughly BMI <25) and aim to maintain this   Aim for 7+ servings of fruits and vegetables daily   65-80+ fluid ounces of water or unsweet tea for healthy kidneys   Limit to max 1 drink of alcohol per day; avoid smoking/tobacco   Limit animal fats in diet for cholesterol and heart health - choose grass fed whenever available   Avoid highly processed foods, and foods high in saturated/trans fats   Aim for low stress - take time to unwind and care for your mental health   Aim for 150 min of moderate intensity exercise weekly for heart health, and weights twice weekly for bone health   Aim for 7-9 hours of sleep daily   When it comes to diets, agreement about the perfect plan isnt easy to find, even among the experts. Experts at the [facility] [employer] developed an idea known as the Healthy Eating Plate. Just imagine a plate divided into logical, healthy portions.   The emphasis is on diet quality:   Load up on vegetables and fruits - one-half of your plate: Aim for color and variety, and remember that potatoes dont count.   Go for whole grains - one-quarter of your plate: Whole wheat, barley, wheat berries, quinoa, oats, brown rice, and foods made with them. If you want pasta, go with  whole wheat pasta.   Protein power - one-quarter of your plate: Fish, chicken, beans,  and nuts are all healthy, versatile protein sources. Limit red meat.   The diet, however, does go beyond the plate, offering a few other suggestions.   Use healthy plant oils, such as olive, canola, soy, corn, sunflower and peanut. Check the labels, and avoid partially hydrogenated oil, which have unhealthy trans fats.   If youre thirsty, drink water. Coffee and tea are good in moderation, but skip sugary drinks and limit milk and dairy products to one or two daily servings.   The type of carbohydrate in the diet is more important than the amount. Some sources of carbohydrates, such as vegetables, fruits, whole grains, and beans-are healthier than others.   Finally, stay active  Signed, Dub Huntsman, DO  06/16/2024 11:50 AM    [facility] "

## 2024-06-16 NOTE — Patient Instructions (Signed)
 Medication Instructions:  Your physician recommends that you continue on your current medications as directed. Please refer to the Current Medication list given to you today.  *If you need a refill on your cardiac medications before your next appointment, please call your pharmacy*   Follow-Up: At [facility], you and your health needs are our priority.  As part of our continuing mission to provide you with exceptional heart care, our providers are all part of one team.  This team includes your primary Cardiologist (physician) and Advanced Practice Providers or APPs (Physician Assistants and Nurse Practitioners) who all work together to provide you with the care you need, when you need it.  Your next appointment:   4 month(s)  Provider:   Kardie Tobb, DO

## 2024-06-19 ENCOUNTER — Encounter: Payer: Self-pay | Admitting: Nurse Practitioner

## 2024-07-01 ENCOUNTER — Other Ambulatory Visit: Payer: Self-pay | Admitting: Cardiology

## 2024-07-01 DIAGNOSIS — I10 Essential (primary) hypertension: Secondary | ICD-10-CM

## 2024-07-15 ENCOUNTER — Encounter: Payer: Self-pay | Admitting: Nurse Practitioner

## 2024-07-15 DIAGNOSIS — I10 Essential (primary) hypertension: Secondary | ICD-10-CM

## 2024-07-15 DIAGNOSIS — E1169 Type 2 diabetes mellitus with other specified complication: Secondary | ICD-10-CM

## 2024-07-15 DIAGNOSIS — E282 Polycystic ovarian syndrome: Secondary | ICD-10-CM

## 2024-07-16 MED ORDER — TIRZEPATIDE 2.5 MG/0.5ML ~~LOC~~ SOAJ: 2.5000 mg | SUBCUTANEOUS | 0 refills | Status: AC

## 2024-07-20 ENCOUNTER — Ambulatory Visit: Admitting: Nurse Practitioner

## 2024-07-21 ENCOUNTER — Other Ambulatory Visit: Payer: Self-pay | Admitting: Nurse Practitioner

## 2024-07-21 DIAGNOSIS — E039 Hypothyroidism, unspecified: Secondary | ICD-10-CM

## 2024-07-23 ENCOUNTER — Ambulatory Visit: Admitting: Nurse Practitioner

## 2024-08-26 ENCOUNTER — Ambulatory Visit: Admitting: Nurse Practitioner

## 2024-09-30 ENCOUNTER — Ambulatory Visit: Admitting: Nurse Practitioner

## 2024-10-23 ENCOUNTER — Ambulatory Visit: Admitting: Cardiology
